# Patient Record
Sex: Female | Born: 1960
Health system: Southern US, Community
[De-identification: ages and names within clinical notes are randomized; demographics above are authoritative.]

## PROBLEM LIST (undated history)

## (undated) DIAGNOSIS — IMO0002 Reserved for concepts with insufficient information to code with codable children: Secondary | ICD-10-CM

## (undated) DIAGNOSIS — G40909 Epilepsy, unspecified, not intractable, without status epilepticus: Secondary | ICD-10-CM

## (undated) DIAGNOSIS — M329 Systemic lupus erythematosus, unspecified: Secondary | ICD-10-CM

## (undated) HISTORY — PX: OTHER SURGICAL HISTORY: SHX169

## (undated) HISTORY — PX: CHOLECYSTECTOMY: SHX55

## (undated) HISTORY — PX: FOOT SURGERY: SHX648

## (undated) HISTORY — PX: KNEE SURGERY: SHX244

## (undated) HISTORY — DX: Systemic lupus erythematosus, unspecified: M32.9

## (undated) HISTORY — DX: Reserved for concepts with insufficient information to code with codable children: IMO0002

---

## 2015-02-12 ENCOUNTER — Ambulatory Visit: Payer: Self-pay | Admitting: Physician Assistant

## 2015-10-17 ENCOUNTER — Emergency Department
Admission: EM | Admit: 2015-10-17 | Discharge: 2015-10-17 | Disposition: A | Discharge status: Home / Self Care | Payer: BLUE CROSS/BLUE SHIELD | Source: Home / Self Care | Attending: Family Medicine | Admitting: Family Medicine

## 2015-10-17 ENCOUNTER — Encounter: Payer: Self-pay | Admitting: Emergency Medicine

## 2015-10-17 DIAGNOSIS — J069 Acute upper respiratory infection, unspecified: Secondary | ICD-10-CM

## 2015-10-17 DIAGNOSIS — B9789 Other viral agents as the cause of diseases classified elsewhere: Principal | ICD-10-CM

## 2015-10-17 HISTORY — DX: Epilepsy, unspecified, not intractable, without status epilepticus: G40.909

## 2015-10-17 LAB — POCT RAPID STREP A (OFFICE): Rapid Strep A Screen: NEGATIVE

## 2015-10-17 MED ORDER — BENZONATATE 200 MG PO CAPS: 200.0000 mg | ORAL_CAPSULE | Freq: Every day | ORAL | Status: DC

## 2015-10-17 MED ORDER — PREDNISONE 20 MG PO TABS: 20.0000 mg | ORAL_TABLET | Freq: Two times a day (BID) | ORAL | Status: DC

## 2015-10-17 NOTE — ED Notes (Addendum)
Pt c/o sore throat and dry cough that started yesterday. No fever. Also noticed white spots on tonsils.

## 2015-10-17 NOTE — Discharge Instructions (Signed)
Take plain guaifenesin (1200mg extended release tabs such as Mucinex) twice daily, with plenty of water, for cough and congestion.  May add Pseudoephedrine (30mg, one or two every 4 to 6 hours) for sinus congestion.  Get adequate rest.   °May use Afrin nasal spray (or generic oxymetazoline) twice daily for about 5 days and then discontinue.  Also recommend using saline nasal spray several times daily and saline nasal irrigation (AYR is a common brand).  Use Flonase nasal spray each morning after using Afrin nasal spray and saline nasal irrigation. °Try warm salt water gargles for sore throat.  °Stop all antihistamines for now, and other non-prescription cough/cold preparations. °Follow-up with family doctor if not improving about10 days.  °

## 2015-10-17 NOTE — ED Provider Notes (Signed)
CSN: [id number]     Arrival date & time 10/17/15  1038 History   First MD Initiated Contact with Patient 10/17/15 1113     Chief Complaint  Patient presents with  . Sore Throat      HPI Comments: Patient complains of one day history of typical cold-like symptoms including mild sore throat, sinus congestion, headache, fatigue, and cough.  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  The history is provided by the patient.    Past Medical History  Diagnosis Date  . Epilepsy [facility])    Past Surgical History  Procedure Laterality Date  . Cholecystectomy    . Uterine ablation    . Knee surgery    . Foot surgery     Family History  Problem Relation Age of Onset  . Stroke Mother   . Hypertension Mother   . Hypertension Father   . Parkinson's disease Father    Social History  Substance Use Topics  . Smoking status: Never Smoker   . Smokeless tobacco: Never Used  . Alcohol Use: 1.8 oz/week    3 Cans of beer per week   OB History    No data available     Review of Systems + sore throat + cough + sneezing No pleuritic pain No wheezing + nasal congestion + post-nasal drainage No sinus pain/pressure No itchy/red eyes No earache No hemoptysis No SOB No fever/chills No nausea No vomiting No abdominal pain No diarrhea No urinary symptoms No skin rash + fatigue No myalgias + headache Used OTC meds without relief  Allergies  Azithromycin; Bactrim; Dilantin; Doxycycline; Penicillins; and Sulfa antibiotics  Home Medications   Prior to Admission medications   Medication Sig Start Date End Date Taking? Authorizing Provider  levETIRAcetam (KEPPRA) 250 MG tablet Take 250 mg by mouth 2 (two) times daily.   Yes Historical Provider, MD  benzonatate (TESSALON) 200 MG capsule Take 1 capsule (200 mg total) by mouth at bedtime. Take as needed for cough 10/17/15   Lattie Haw, MD  predniSONE (DELTASONE) 20 MG tablet Take 1 tablet (20  mg total) by mouth 2 (two) times daily. Take with food. 10/17/15   Lattie Haw, MD   Meds Ordered and Administered this Visit  Medications - No data to display  BP 135/85 mmHg  Pulse 74  Temp(Src) 97.4 F (36.3 C) (Oral)  Wt 162 lb (73.483 kg)  SpO2 98% No data found.   Physical Exam Nursing notes and Vital Signs reviewed. Appearance:  Patient appears stated age, and in no acute distress Eyes:  Pupils are equal, round, and reactive to light and accomodation.  Extraocular movement is intact.  Conjunctivae are not inflamed  Ears:  Canals normal.  Tympanic membranes normal.  Nose:  Congested turbinates.  No sinus tenderness.   Pharynx:  Uvula mildly swollen. Neck:  Supple.   Tender enlarged posterior nodes are palpated bilaterally  Lungs:  Clear to auscultation.  Breath sounds are equal.  Moving air well. Heart:  Regular rate and rhythm without murmurs, rubs, or gallops.  Abdomen:  Nontender without masses or hepatosplenomegaly.  Bowel sounds are present.  No CVA or flank tenderness.  Extremities:  No edema.  No calf tenderness Skin:  No rash present.   ED Course  Procedures  None    Labs Reviewed  POCT RAPID STREP A (OFFICE) negative    MDM   1. Viral URI with cough    There is  no evidence of bacterial infection today.  Treat symptomatically for now  Begin prednisone burst. Prescription written for Benzonatate (Tessalon) to take at bedtime for night-time cough.  Take plain guaifenesin (1200mg  extended release tabs such as Mucinex) twice daily, with plenty of water, for cough and congestion.  May add Pseudoephedrine (30mg , one or two every 4 to 6 hours) for sinus congestion.  Get adequate rest.   May use Afrin nasal spray (or generic oxymetazoline) twice daily for about 5 days and then discontinue.  Also recommend using saline nasal spray several times daily and saline nasal irrigation (AYR is a common brand).  Use Flonase nasal spray each morning after using Afrin nasal  spray and saline nasal irrigation. Try warm salt water gargles for sore throat.  Stop all antihistamines for now, and other non-prescription cough/cold preparations.  Follow-up with family doctor if not improving about10 days.     Lattie HawStephen A Beese, MD 10/22/15 [phone]

## 2015-11-19 LAB — LIPID PANEL
Cholesterol: 215 mg/dL — AB (ref 0–200)
HDL: 59 mg/dL (ref 35–70)
LDL Cholesterol: 123 mg/dL
Triglycerides: 166 mg/dL — AB (ref 40–160)

## 2015-11-19 LAB — CBC AND DIFFERENTIAL
HCT: 40 % (ref 36–46)
Hemoglobin: 13.7 g/dL (ref 12.0–16.0)
Platelets: 261 10*3/uL (ref 150–399)
WBC: 6.4 10^3/mL

## 2015-11-19 LAB — HEMOGLOBIN A1C: Hemoglobin A1C: 5.6

## 2015-11-19 LAB — HEPATIC FUNCTION PANEL
ALT: 35 U/L (ref 7–35)
AST: 19 U/L (ref 13–35)
Alkaline Phosphatase: 60 U/L (ref 25–125)
Bilirubin, Total: 0.5 mg/dL

## 2015-11-19 LAB — BASIC METABOLIC PANEL
BUN: 11 mg/dL (ref 4–21)
Creatinine: 0.8 mg/dL (ref <=1.1)
Glucose: 89 mg/dL
Potassium: 4.6 mmol/L (ref 3.4–5.3)
Sodium: 149 mmol/L — AB (ref 137–147)

## 2016-02-14 LAB — HM PAP SMEAR: HM Pap smear: NEGATIVE

## 2016-11-24 ENCOUNTER — Encounter: Payer: Self-pay | Admitting: Physician Assistant

## 2016-11-24 ENCOUNTER — Ambulatory Visit (INDEPENDENT_AMBULATORY_CARE_PROVIDER_SITE_OTHER): Payer: BLUE CROSS/BLUE SHIELD | Admitting: Physician Assistant

## 2016-11-24 VITALS — BP 121/78 | HR 78 | Ht 59.0 in | Wt 155.0 lb

## 2016-11-24 DIAGNOSIS — G40909 Epilepsy, unspecified, not intractable, without status epilepticus: Secondary | ICD-10-CM

## 2016-11-24 DIAGNOSIS — R5383 Other fatigue: Secondary | ICD-10-CM

## 2016-11-24 DIAGNOSIS — Z1231 Encounter for screening mammogram for malignant neoplasm of breast: Secondary | ICD-10-CM

## 2016-11-24 DIAGNOSIS — G4733 Obstructive sleep apnea (adult) (pediatric): Secondary | ICD-10-CM

## 2016-11-24 DIAGNOSIS — Z0000 Encounter for general adult medical examination without abnormal findings: Secondary | ICD-10-CM | POA: Diagnosis not present

## 2016-11-24 DIAGNOSIS — Z1211 Encounter for screening for malignant neoplasm of colon: Secondary | ICD-10-CM

## 2016-11-24 DIAGNOSIS — Z9989 Dependence on other enabling machines and devices: Secondary | ICD-10-CM

## 2016-11-24 LAB — CBC
HCT: 40.9 % (ref 35.0–45.0)
Hemoglobin: 13.8 g/dL (ref 11.7–15.5)
MCH: 29.8 pg (ref 27.0–33.0)
MCHC: 33.7 g/dL (ref 32.0–36.0)
MCV: 88.3 fL (ref 80.0–100.0)
MPV: 10 fL (ref 7.5–12.5)
Platelets: 224 10*3/uL (ref 140–400)
RBC: 4.63 MIL/uL (ref 3.80–5.10)
RDW: 13.3 % (ref 11.0–15.0)
WBC: 5.9 10*3/uL (ref 3.8–10.8)

## 2016-11-24 LAB — LIPID PANEL
Cholesterol: 214 mg/dL — ABNORMAL HIGH (ref <200)
HDL: 54 mg/dL (ref >50)
LDL Cholesterol: 130 mg/dL — ABNORMAL HIGH (ref <100)
Total CHOL/HDL Ratio: 4 Ratio (ref <5.0)
Triglycerides: 149 mg/dL (ref <150)
VLDL: 30 mg/dL (ref <30)

## 2016-11-24 LAB — COMPREHENSIVE METABOLIC PANEL
ALT: 20 U/L (ref 6–29)
AST: 14 U/L (ref 10–35)
Albumin: 4.5 g/dL (ref 3.6–5.1)
Alkaline Phosphatase: 54 U/L (ref 33–130)
BUN: 15 mg/dL (ref 7–25)
CO2: 27 mmol/L (ref 20–31)
Calcium: 10 mg/dL (ref 8.6–10.4)
Chloride: 102 mmol/L (ref 98–110)
Creat: 0.82 mg/dL (ref 0.50–1.05)
Glucose, Bld: 89 mg/dL (ref 65–99)
Potassium: 4.3 mmol/L (ref 3.5–5.3)
Sodium: 140 mmol/L (ref 135–146)
Total Bilirubin: 0.4 mg/dL (ref 0.2–1.2)
Total Protein: 6.7 g/dL (ref 6.1–8.1)

## 2016-11-24 LAB — FERRITIN: Ferritin: 96 ng/mL (ref 10–232)

## 2016-11-24 LAB — VITAMIN B12: Vitamin B-12: 1646 pg/mL — ABNORMAL HIGH (ref 200–1100)

## 2016-11-24 LAB — TSH: TSH: 0.93 mIU/L

## 2016-11-24 NOTE — Patient Instructions (Signed)
1200-1500 calories    Keeping You Healthy  Get These Tests  Blood Pressure- Have your blood pressure checked by your healthcare provider at least once a year.  Normal blood pressure is 120/80.  Weight- Have your body mass index (BMI) calculated to screen for obesity.  BMI is a measure of body fat based on height and weight.  You can calculate your own BMI at https://www.west-esparza.com/www.nhlbisupport.com/bmi/  Cholesterol- Have your cholesterol checked every year.  Diabetes- Have your blood sugar checked every year if you have high blood pressure, high cholesterol, a family history of diabetes or if you are overweight.  Pap Test - Have a pap test every 1 to 5 years if you have been sexually active.  If you are older than 65 and recent pap tests have been normal you may not need additional pap tests.  In addition, if you have had a hysterectomy  for benign disease additional pap tests are not necessary.  Mammogram-Yearly mammograms are essential for early detection of breast cancer  Screening for Colon Cancer- Colonoscopy starting at age 58. Screening may begin sooner depending on your family history and other health conditions.  Follow up colonoscopy as directed by your Gastroenterologist.  Screening for Osteoporosis- Screening begins at age 58 with bone density scanning, sooner if you are at higher risk for developing Osteoporosis.  Get these medicines  Calcium with Vitamin D- Your body requires 1200-1500 mg of Calcium a day and [phone] IU of Vitamin D a day.  You can only absorb 500 mg of Calcium at a time therefore Calcium must be taken in 2 or 3 separate doses throughout the day.  Hormones- Hormone therapy has been associated with increased risk for certain cancers and heart disease.  Talk to your healthcare provider about if you need relief from menopausal symptoms.  Aspirin- Ask your healthcare provider about taking Aspirin to prevent Heart Disease and Stroke.  Get these Immuniztions  Flu shot- Every  fall  Pneumonia shot- Once after the age of 58; if you are younger ask your healthcare provider if you need a pneumonia shot.  Tetanus- Every ten years.  Zostavax- Once after the age of 58 to prevent shingles.  Take these steps  Don't smoke- Your healthcare provider can help you quit. For tips on how to quit, ask your healthcare provider or go to www.smokefree.gov or call 1-800 QUIT-NOW.  Be physically active- Exercise 5 days a week for a minimum of 30 minutes.  If you are not already physically active, start slow and gradually work up to 30 minutes of moderate physical activity.  Try walking, dancing, bike riding, swimming, etc.  Eat a healthy diet- Eat a variety of healthy foods such as fruits, vegetables, whole grains, low fat milk, low fat cheeses, yogurt, lean meats, chicken, fish, eggs, dried beans, tofu, etc.  For more information go to www.thenutritionsource.org  Dental visit- Brush and floss teeth twice daily; visit your dentist twice a year.  Eye exam- Visit your Optometrist or Ophthalmologist yearly.  Drink alcohol in moderation- Limit alcohol intake to one drink or less a day.  Never drink and drive.  Depression- Your emotional health is as important as your physical health.  If you're feeling down or losing interest in things you normally enjoy, please talk to your healthcare provider.  Seat Belts- can save your life; always wear one  Smoke/Carbon Monoxide detectors- These detectors need to be installed on the appropriate level of your home.  Replace batteries at least once  a year.  Violence- If anyone is threatening or hurting you, please tell your healthcare provider.  Living Will/ Health care power of attorney- Discuss with your healthcare provider and family.

## 2016-11-24 NOTE — Progress Notes (Addendum)
Subjective:     Bonnie Allen is a 56 y.o. female and is here for a comprehensive physical exam. The patient reports she has complex partial seizures and takes Keppra and Onfi, which are managed by her neurologist.  She states her last seizure was 11/18/16, and that her neurologist has recently increased her dose of onfi.  Patient reports she would like to discuss her weight gain.  She states she has tried the "daniel" diet, and lost 20lb but the lack of protein lead to increased seizures.   She states she uses myfitness pal to track food and walks on the treadmill 3 times per week.  She states she is not interested in taking a weight loss medications, but she would like advice for weight loss.    Patient states she takes calicum and vitamin D 1200mg  daily, B12, as well as a multivitamin, and fish oil.    She states she had a dexa scan 1 year, which did not show osteopenia or osteoporosis.  She does state she needs a mammogram and a colonoscopy.  Patient declined getting a flu shot today.      Patient reports she would like routine labs and a TSH checked.  She states she has noticed increased fatigue even when she sleeps 10 hours per night.    Social History   Social History  . Marital status: Married    Spouse name: N/A  . Number of children: N/A  . Years of education: N/A   Occupational History  . Not on file.   Social History Main Topics  . Smoking status: Never Smoker  . Smokeless tobacco: Never Used  . Alcohol use 1.8 oz/week    3 Cans of beer per week  . Drug use: No  . Sexual activity: Yes   Other Topics Concern  . Not on file   Social History Narrative  . No narrative on file   Health Maintenance  Topic Date Due  . Hepatitis C Screening  [birth year]  . HIV Screening  05/13/1974  . TETANUS/TDAP  05/13/1978  . PAP SMEAR  05/13/1980  . MAMMOGRAM  05/13/2009  . COLONOSCOPY  05/13/2009  . INFLUENZA VACCINE  11/24/2017 (Originally 05/25/2016)    The following portions of  the patient's history were reviewed and updated as appropriate: allergies, current medications, past family history, past medical history, past social history, past surgical history and problem list.  Review of Systems A comprehensive review of systems was negative.   Objective:    General appearance: alert, cooperative and appears stated age Head: Normocephalic, without obvious abnormality, atraumatic Eyes: conjunctivae/corneas clear. PERRL, EOM's intact. Fundi benign. Ears: normal TM's and external ear canals both ears Nose: Nares normal. Septum midline. Mucosa normal. No drainage or sinus tenderness. Throat: lips, mucosa, and tongue normal; teeth and gums normal Neck: no adenopathy, no carotid bruit, no JVD, supple, symmetrical, trachea midline and thyroid not enlarged, symmetric, no tenderness/mass/nodules Back: symmetric, no curvature. ROM normal. No CVA tenderness. Lungs: clear to auscultation bilaterally Heart: regular rate and rhythm, S1, S2 normal, no murmur, click, rub or gallop Abdomen: soft, non-tender; bowel sounds normal; no masses,  no organomegaly Extremities: extremities normal, atraumatic, no cyanosis or edema Pulses: 2+ and symmetric Skin: Skin color, texture, turgor normal. No rashes or lesions    Assessment:    Healthy female exam.      Plan:  Marland Kitchen.Marland Kitchen.Bonnie Allen was seen today for establish care.  Diagnoses and all orders for this visit:  Seizure disorder (HCC)  OSA on CPAP  Routine physical examination -     MM DIGITAL SCREENING BILATERAL -     Ambulatory referral to Gastroenterology -     CBC -     Comprehensive metabolic panel -     C-reactive protein -     Ferritin -     Sedimentation rate -     TSH -     Vitamin B12 -     Vit D  25 hydroxy (rtn osteoporosis monitoring) -     Lipid panel  Visit for screening mammogram -     MM DIGITAL SCREENING BILATERAL  Colon cancer screening -     Ambulatory referral to Gastroenterology  No energy -     CBC -      Comprehensive metabolic panel -     C-reactive protein -     Ferritin -     Sedimentation rate -     TSH -     Vitamin B12 -     Vit D  25 hydroxy (rtn osteoporosis monitoring) -     Lipid panel       See After Visit Summary for Counseling Recommendations    Patient was encouraged to start taking a daily aspirin 81mg  for cardiovascular health.   Encouraged patient to continue to walk on the treadmill.  Recommended at least 150 minutes of exercise weekly.  Instructed patient to continue to track her calories and diet with myfitnesspal and try to keep calories 1200-1500 calories per day and eat smaller meals more frequently.      Patient will continue to follow-up with her neurologist for the management of her epilepsy.    Routine labs were ordered to screen for diabetes, check renal and liver function, and lipids.  A TSH, CBC, ferritin, sed rate, CRP, B12 and vitamin D level will be checked due to patient's increased fatigue.     Orders were placed for a screening colonoscopy and mammogram.    Patient can follow-up as needed.

## 2016-11-25 LAB — SEDIMENTATION RATE: Sed Rate: 1 mm/hr (ref 0–30)

## 2016-11-25 LAB — VITAMIN D 25 HYDROXY (VIT D DEFICIENCY, FRACTURES): Vit D, 25-Hydroxy: 65 ng/mL (ref 30–100)

## 2016-11-25 LAB — C-REACTIVE PROTEIN: CRP: 5.8 mg/L (ref <8.0)

## 2016-11-26 ENCOUNTER — Encounter: Payer: Self-pay | Admitting: Physician Assistant

## 2016-11-26 DIAGNOSIS — E785 Hyperlipidemia, unspecified: Secondary | ICD-10-CM | POA: Insufficient documentation

## 2016-11-27 ENCOUNTER — Encounter: Payer: Self-pay | Admitting: Physician Assistant

## 2016-12-10 ENCOUNTER — Ambulatory Visit (INDEPENDENT_AMBULATORY_CARE_PROVIDER_SITE_OTHER): Payer: BLUE CROSS/BLUE SHIELD

## 2016-12-10 DIAGNOSIS — Z1231 Encounter for screening mammogram for malignant neoplasm of breast: Secondary | ICD-10-CM | POA: Diagnosis not present

## 2016-12-10 NOTE — Progress Notes (Signed)
Call pt: normal mammogram. Follow up in 1 year.

## 2017-01-03 ENCOUNTER — Encounter: Payer: Self-pay | Admitting: Physician Assistant

## 2017-01-31 ENCOUNTER — Encounter: Payer: Self-pay | Admitting: Emergency Medicine

## 2017-01-31 ENCOUNTER — Emergency Department
Admission: EM | Admit: 2017-01-31 | Discharge: 2017-01-31 | Disposition: A | Discharge status: Home / Self Care | Payer: BLUE CROSS/BLUE SHIELD | Source: Home / Self Care | Attending: Family Medicine | Admitting: Family Medicine

## 2017-01-31 DIAGNOSIS — J040 Acute laryngitis: Secondary | ICD-10-CM | POA: Diagnosis not present

## 2017-01-31 LAB — POCT RAPID STREP A (OFFICE): Rapid Strep A Screen: NEGATIVE

## 2017-01-31 MED ORDER — PREDNISONE 20 MG PO TABS: ORAL_TABLET | ORAL | 0 refills | Status: DC

## 2017-01-31 MED ORDER — FLUTICASONE PROPIONATE 50 MCG/ACT NA SUSP: 2.0000 | Freq: Every day | NASAL | 2 refills | Status: DC

## 2017-01-31 NOTE — ED Triage Notes (Signed)
Pt c/o laryngitis and nasal congestion x2 days. States it is getting worse. She is using OTC meds.

## 2017-01-31 NOTE — ED Provider Notes (Signed)
CSN: [id number]     Arrival date & time 01/31/17  1727 History   First MD Initiated Contact with Patient 01/31/17 1744     Chief Complaint  Patient presents with  . Laryngitis   (Consider location/radiation/quality/duration/timing/severity/associated sxs/prior Treatment) HPI Bonnie Allen is a 56 y.o. female presenting to UC with c/o gradually worsening hoarse voice and nasal congestion for 2-3 days.  Symptoms initially started with a sore throat but the throat pain has resolved. She has felt some swelling of her lymph nodes, worse on Left side of neck and mild Left ear soreness. Denies fever, chills, n/v/d. No known sick contacts. She has tried OTC mucinex, warm tea with honey and saltwater gargles w/o relief.    Past Medical History:  Diagnosis Date  . Epilepsy [facility])    Past Surgical History:  Procedure Laterality Date  . CESAREAN SECTION    . CHOLECYSTECTOMY    . FOOT SURGERY    . KNEE SURGERY    . uterine ablation     Family History  Problem Relation Age of Onset  . Stroke Mother   . Hypertension Mother   . Hypertension Father   . Parkinson's disease Father   . Hyperlipidemia Father   . Drug abuse Paternal Uncle   . Cancer Paternal Grandmother     lung  . Stroke Paternal Grandmother   . Cancer Paternal Grandfather     lung   Social History  Substance Use Topics  . Smoking status: Never Smoker  . Smokeless tobacco: Never Used  . Alcohol use 1.8 oz/week    3 Cans of beer per week   OB History    No data available     Review of Systems  Constitutional: Negative for chills and fever.  HENT: Positive for congestion, rhinorrhea, sore throat and voice change. Negative for ear pain and trouble swallowing.   Respiratory: Negative for cough and shortness of breath.   Cardiovascular: Negative for chest pain and palpitations.  Gastrointestinal: Negative for abdominal pain, diarrhea, nausea and vomiting.  Musculoskeletal: Negative for arthralgias, back pain and myalgias.   Skin: Negative for rash.    Allergies  Azithromycin; Bactrim [sulfamethoxazole-trimethoprim]; Dilantin [phenytoin sodium extended]; Doxycycline; Penicillins; and Sulfa antibiotics  Home Medications   Prior to Admission medications   Medication Sig Start Date End Date Taking? Authorizing Provider  Calcium Citrate-Vitamin D (CALCIUM + D PO) Take by mouth.    Historical Provider, MD  CloBAZam (ONFI PO) Take 50 mg by mouth daily.    Historical Provider, MD  Cyanocobalamin (B-12 PO) Take by mouth.    Historical Provider, MD  fluticasone (FLONASE) 50 MCG/ACT nasal spray Place 2 sprays into both nostrils daily. 01/31/17   Junius Finner, PA-C  LevETIRAcetam (KEPPRA PO) Take 2,250 mg by mouth at bedtime.    Historical Provider, MD  Multiple Vitamin (MULTIVITAMIN) tablet Take 1 tablet by mouth daily.    Historical Provider, MD  Omega-3 Fatty Acids (FISH OIL PO) Take by mouth.    Historical Provider, MD  predniSONE (DELTASONE) 20 MG tablet 3 tabs po day one, then 2 po daily x 4 days 01/31/17   Junius Finner, PA-C   Meds Ordered and Administered this Visit  Medications - No data to display  BP 125/73 (BP Location: Right Arm)   Pulse 76   Temp 98 F (36.7 C) (Oral)   Wt 155 lb (70.3 kg)   SpO2 96%   BMI 31.31 kg/m  No data found.   Physical Exam  Constitutional: She is oriented to person, place, and time. She appears well-developed and well-nourished. No distress.  HENT:  Head: Normocephalic and atraumatic.  Right Ear: Tympanic membrane normal.  Left Ear: Tympanic membrane normal.  Nose: Nose normal.  Mouth/Throat: Uvula is midline and mucous membranes are normal. Oropharyngeal exudate and posterior oropharyngeal erythema present. No posterior oropharyngeal edema.  Eyes: EOM are normal.  Neck: Normal range of motion. Neck supple.  Hoarse voice, no stridor.   Cardiovascular: Normal rate and regular rhythm.   Pulmonary/Chest: Effort normal and breath sounds normal. No stridor. No  respiratory distress. She has no wheezes. She has no rales.  Musculoskeletal: Normal range of motion.  Lymphadenopathy:    She has cervical adenopathy.  Neurological: She is alert and oriented to person, place, and time.  Skin: Skin is warm and dry. She is not diaphoretic.  Psychiatric: She has a normal mood and affect. Her behavior is normal.  Nursing note and vitals reviewed.   Urgent Care Course     Procedures (including critical care time)  Labs Review Labs Reviewed  POCT RAPID STREP A (OFFICE)    Imaging Review No results found.    MDM   1. Laryngitis    Rapid strep- negative  Symptoms likely viral Encouraged to keep trying symptomatic treatment. Rx: Prednisone and Flonase  f/u with PCP toward end of week if not improving.     Junius Finner, PA-C 01/31/17 1825

## 2017-02-03 ENCOUNTER — Ambulatory Visit (INDEPENDENT_AMBULATORY_CARE_PROVIDER_SITE_OTHER): Payer: BLUE CROSS/BLUE SHIELD | Admitting: Physician Assistant

## 2017-02-04 ENCOUNTER — Ambulatory Visit: Payer: BLUE CROSS/BLUE SHIELD | Admitting: Family Medicine

## 2017-02-04 ENCOUNTER — Ambulatory Visit (INDEPENDENT_AMBULATORY_CARE_PROVIDER_SITE_OTHER): Payer: BLUE CROSS/BLUE SHIELD | Admitting: Family Medicine

## 2017-02-04 ENCOUNTER — Encounter: Payer: Self-pay | Admitting: Family Medicine

## 2017-02-04 VITALS — BP 123/75 | HR 76 | Ht 59.0 in | Wt 156.0 lb

## 2017-02-04 DIAGNOSIS — B9789 Other viral agents as the cause of diseases classified elsewhere: Secondary | ICD-10-CM

## 2017-02-04 DIAGNOSIS — J069 Acute upper respiratory infection, unspecified: Secondary | ICD-10-CM | POA: Diagnosis not present

## 2017-02-04 NOTE — Progress Notes (Signed)
   Subjective:    Patient ID: Bonnie Allen, female    DOB: 1960-11-09, 56 y.o.   MRN: [id number]  HPI 56 year old female with 8 days of nasal congestion and cough with productive sputum. She was actually seen in urgent care on April night, approximately 4 days ago and diagnosed with laryngitis. She had a negative strep test at that time. She was given prednisone and Flonase. She is here today because she is not feeling any better. Waking up with eyes crusting in the AM.  She has not had any facial pain or headache with it. She still feels congested. She does feel like she's getting a lot of postnasal drip. She feels like her cough is maybe a little bit better today. No shortness of breath or wheezing.   Review of Systems     Objective:   Physical Exam  Constitutional: She is oriented to person, place, and time. She appears well-developed and well-nourished.  HENT:  Head: Normocephalic and atraumatic.  Right Ear: External ear normal.  Left Ear: External ear normal.  Nose: Nose normal.  Mouth/Throat: Oropharynx is clear and moist.  TMs and canals are clear.   Eyes: Conjunctivae and EOM are normal. Pupils are equal, round, and reactive to light.  Neck: Neck supple. No thyromegaly present.  Cardiovascular: Normal rate, regular rhythm and normal heart sounds.   Pulmonary/Chest: Effort normal and breath sounds normal. She has no wheezes.  Lymphadenopathy:    She has no cervical adenopathy.  Neurological: She is alert and oriented to person, place, and time.  Skin: Skin is warm and dry.  Psychiatric: She has a normal mood and affect.          Assessment & Plan:  Upper respiratory infection-recommend warm compresses for her eyes. Continue over-the-counter medication as needed. Encouraged her to rest and hydrate over the weekend. If she is not feeling better by Monday then give [place] a call back and we can consider treatment with either a cephalosporin or possibly a fluoroquinolone since she  has multiple drug intolerances. I do feel like she is getting better.

## 2017-02-09 ENCOUNTER — Telehealth: Payer: Self-pay | Admitting: *Deleted

## 2017-02-09 MED ORDER — CEFDINIR 300 MG PO CAPS: 300.0000 mg | ORAL_CAPSULE | Freq: Two times a day (BID) | ORAL | 0 refills | Status: DC

## 2017-02-09 NOTE — Telephone Encounter (Signed)
Pt called and stated that she was seen on Friday for URI and was advised that if she wasn't feeling any better to call and she would be given an ABX. Pt states that she is not feeling any better and would like this to be sent to her pharmacy.  As per the assessment and plan:  Consider treatment with either a cephalosporin or possibly a fluoroquinolone since she has multiple drug intolerances.  Will fwd to Dr. Linford Arnold for f/u.Loralee Pacas Shiner

## 2017-02-09 NOTE — Telephone Encounter (Signed)
Rx sent for omnicef.  Call if not better in 5-7 days.

## 2017-02-10 ENCOUNTER — Ambulatory Visit (INDEPENDENT_AMBULATORY_CARE_PROVIDER_SITE_OTHER): Payer: BLUE CROSS/BLUE SHIELD | Admitting: Physician Assistant

## 2017-02-10 ENCOUNTER — Encounter: Payer: Self-pay | Admitting: Family Medicine

## 2017-02-10 ENCOUNTER — Encounter: Payer: Self-pay | Admitting: Physician Assistant

## 2017-02-10 VITALS — BP 129/76 | HR 73 | Temp 97.9°F | Ht 59.0 in | Wt 157.0 lb

## 2017-02-10 DIAGNOSIS — L304 Erythema intertrigo: Secondary | ICD-10-CM

## 2017-02-10 DIAGNOSIS — B029 Zoster without complications: Secondary | ICD-10-CM

## 2017-02-10 MED ORDER — NYSTATIN 100000 UNIT/GM EX CREA: 1.0000 "application " | TOPICAL_CREAM | Freq: Two times a day (BID) | CUTANEOUS | 0 refills | Status: DC

## 2017-02-10 MED ORDER — VALACYCLOVIR HCL 1 G PO TABS: 1000.0000 mg | ORAL_TABLET | Freq: Three times a day (TID) | ORAL | 0 refills | Status: DC

## 2017-02-10 NOTE — Telephone Encounter (Signed)
Pt advised, she does have an appt with a Provider today. She will keep that appt.

## 2017-02-10 NOTE — Patient Instructions (Signed)
Shingles Shingles is an infection that causes a painful skin rash and fluid-filled blisters. Shingles is caused by the same virus that causes chickenpox. Shingles only develops in people who:  Have had chickenpox.  Have gotten the chickenpox vaccine. (This is rare.) The first symptoms of shingles may be itching, tingling, or pain in an area on your skin. A rash will follow in a few days or weeks. The rash is usually on one side of the body in a bandlike or beltlike pattern. Over time, the rash turns into fluid-filled blisters that break open, scab over, and dry up. Medicines may:  Help you manage pain.  Help you recover more quickly.  Help to prevent long-term problems. Follow these instructions at home: Medicines  Take medicines only as told by your doctor.  Apply an anti-itch or numbing cream to the affected area as told by your doctor. Blister and Rash Care  Take a cool bath or put cool compresses on the area of the rash or blisters as told by your doctor. This may help with pain and itching.  Keep your rash covered with a loose bandage (dressing). Wear loose-fitting clothing.  Keep your rash and blisters clean with mild soap and cool water or as told by your doctor.  Check your rash every day for signs of infection. These include redness, swelling, and pain that lasts or gets worse.  Do not pick your blisters.  Do not scratch your rash. General instructions  Rest as told by your doctor.  Keep all follow-up visits as told by your doctor. This is important.  Until your blisters scab over, your infection can cause chickenpox in people who have never had it or been vaccinated against it. To prevent this from happening, avoid touching other people or being around other people, especially:  Babies.  Pregnant women.  Children who have eczema.  Elderly people who have transplants.  People who have chronic illnesses, such as leukemia or AIDS. Contact a doctor if:  Your  pain does not get better with medicine.  Your pain does not get better after the rash heals.  Your rash looks infected. Signs of infection include:  Redness.  Swelling.  Pain that lasts or gets worse. Get help right away if:  The rash is on your face or nose.  You have pain in your face, pain around your eye area, or loss of feeling on one side of your face.  You have ear pain or you have ringing in your ear.  You have loss of taste.  Your condition gets worse. This information is not intended to replace advice given to you by your health care provider. Make sure you discuss any questions you have with your health care provider. Document Released: 03/29/2008 Document Revised: 06/06/2016 Document Reviewed: 07/23/2014 Elsevier Interactive Patient Education  2017 Elsevier Inc.  

## 2017-02-10 NOTE — Progress Notes (Signed)
   Subjective:    Patient ID: Bonnie Allen, female    DOB: 06/16/1961, 56 y.o.   MRN: [id number]  HPI  Pt is a 56 yo female who presents to the clinic with painful burning rash of left chest wall breast and under breast. Rash has been present for 2 days. She has used neosporin without any benefit. No itching. Areas of rash seems to be getting a little bigger. She does have vesicles present and some have broken open and oozing. She had normal mammogram in feb of this year. Pt has not had shingles vaccine. Pt has recently been fighting and URI and very stressed taking care of her parents.    Review of Systems See HPI.     Objective:   Physical Exam  Constitutional: She appears well-developed and well-nourished.  HENT:  Head: Normocephalic and atraumatic.  Cardiovascular: Normal rate, regular rhythm and normal heart sounds.   Pulmonary/Chest: Effort normal and breath sounds normal.    Psychiatric: She has a normal mood and affect. Her behavior is normal.          Assessment & Plan:  Marland KitchenMarland KitchenRebbie was seen today for rash and cough.  Diagnoses and all orders for this visit:  Herpes zoster without complication -     valACYclovir (VALTREX) 1000 MG tablet; Take 1 tablet (1,000 mg total) by mouth 3 (three) times daily. For 7 day -     Viral culture  Intertrigo -     nystatin cream (MYCOSTATIN); Apply 1 application topically 2 (two) times daily. As needed under breast.   I suspect two things going on here. I feel like she has shingles with some intertrigo. Valtrex started. Discussed to not be around young children, sick older people or pregnant women.  Discussed cool compresses and OTC tylenol and NSAIDs for pain.  Nystatin given to use under left breast.  Culture sent for confirmation.

## 2017-02-12 ENCOUNTER — Encounter: Payer: Self-pay | Admitting: Physician Assistant

## 2017-02-12 DIAGNOSIS — B029 Zoster without complications: Secondary | ICD-10-CM | POA: Insufficient documentation

## 2017-02-12 DIAGNOSIS — L304 Erythema intertrigo: Secondary | ICD-10-CM | POA: Insufficient documentation

## 2017-02-15 ENCOUNTER — Emergency Department (INDEPENDENT_AMBULATORY_CARE_PROVIDER_SITE_OTHER): Payer: BLUE CROSS/BLUE SHIELD

## 2017-02-15 ENCOUNTER — Emergency Department
Admission: EM | Admit: 2017-02-15 | Discharge: 2017-02-15 | Disposition: A | Discharge status: Home / Self Care | Payer: BLUE CROSS/BLUE SHIELD | Source: Home / Self Care | Attending: Family Medicine | Admitting: Family Medicine

## 2017-02-15 ENCOUNTER — Encounter: Payer: Self-pay | Admitting: *Deleted

## 2017-02-15 DIAGNOSIS — J323 Chronic sphenoidal sinusitis: Secondary | ICD-10-CM

## 2017-02-15 DIAGNOSIS — J0130 Acute sphenoidal sinusitis, unspecified: Secondary | ICD-10-CM

## 2017-02-15 DIAGNOSIS — R519 Headache, unspecified: Secondary | ICD-10-CM

## 2017-02-15 DIAGNOSIS — R51 Headache: Secondary | ICD-10-CM | POA: Diagnosis not present

## 2017-02-15 DIAGNOSIS — G43909 Migraine, unspecified, not intractable, without status migrainosus: Secondary | ICD-10-CM

## 2017-02-15 DIAGNOSIS — G319 Degenerative disease of nervous system, unspecified: Secondary | ICD-10-CM

## 2017-02-15 LAB — REFLEX ADENOVIRUS CULTURE

## 2017-02-15 LAB — RFX HSV/VARICELLA ZOSTER RAPID CULT

## 2017-02-15 MED ORDER — DEXAMETHASONE SODIUM PHOSPHATE 10 MG/ML IJ SOLN
10.0000 mg | Freq: Once | INTRAMUSCULAR | Status: AC
  Administered 2017-02-15: 10 mg via INTRAMUSCULAR

## 2017-02-15 MED ORDER — ONDANSETRON 4 MG PO TBDP
4.0000 mg | ORAL_TABLET | Freq: Once | ORAL | Status: AC
  Administered 2017-02-15: 4 mg via ORAL

## 2017-02-15 MED ORDER — KETOROLAC TROMETHAMINE 60 MG/2ML IJ SOLN
60.0000 mg | Freq: Once | INTRAMUSCULAR | Status: AC
  Administered 2017-02-15: 60 mg via INTRAMUSCULAR

## 2017-02-15 NOTE — Discharge Instructions (Signed)
[employer]. Take plain guaifenesin (  extended release tabs such as Mucinex) twice daily, with plenty of water, for cough and congestion.  May add Pseudoephedrine ( , one or two every 4 to 6 hours) for sinus congestion.  Get adequate rest.   May use Afrin nasal spray (or generic oxymetazoline) each morning for about 5 days and then discontinue.  Also recommend using saline nasal spray several times daily and saline nasal irrigation (AYR is a common brand).  Use Flonase nasal spray each morning after using Afrin nasal spray and saline nasal irrigation. Try warm salt water gargles for sore throat.  Stop all antihistamines for now, and other non-prescription cough/cold preparations. May take Delsym Cough Suppressant at bedtime for nighttime cough.

## 2017-02-15 NOTE — ED Provider Notes (Signed)
[facility]    CSN: [id number] Arrival date & time: 02/15/17  1458     History   Chief Complaint Chief Complaint  Patient presents with  . Seizures  . Headache    HPI Bonnie Allen is a 56 y.o. female.   Patient developed a URI about two weeks ago and was subsequently treated with [facility], which she has not quite finished.  She has a history of seizure disorder, typically having about two seizures per week.  She also has a history of occasional migraine headache, usually on the right side.  At 2pm today she developed a typical seizure that lasted about 20 to 30 seconds.  Afterwards she developed a severed frontal and general headache, not typical for her usual migraines, that has persisted.  She notes that she never gets postictal headaches.  She denies other neurologic symptoms.  No recent fevers, chills, and sweats. She admits that she has been under increased stress recently.   The history is provided by the patient and the spouse.  Seizures  Headache  Associated symptoms: congestion, fatigue, nausea, seizures and sinus pressure   Associated symptoms: no ear pain, no fever, no numbness, no sore throat, no vomiting and no weakness     Past Medical History:  Diagnosis Date  . Epilepsy [facility])     Patient Active Problem List   Diagnosis Date Noted  . Intertrigo 02/12/2017  . Herpes zoster without complication 02/12/2017  . Hyperlipidemia LDL goal <130 11/26/2016  . OSA on CPAP 11/24/2016  . Seizure disorder (HCC) 11/24/2016    Past Surgical History:  Procedure Laterality Date  . CESAREAN SECTION    . CHOLECYSTECTOMY    . FOOT SURGERY    . KNEE SURGERY    . uterine ablation      OB History    No data available       Home Medications    Prior to Admission medications   Medication Sig Start Date End Date Taking? Authorizing Provider  Calcium Citrate-Vitamin D (CALCIUM + D PO) Take by mouth.    Historical Provider, MD  cefdinir (OMNICEF) 300 MG  capsule Take 1 capsule (300 mg total) by mouth 2 (two) times daily. 02/09/17   Agapito Games, MD  CloBAZam (ONFI PO) Take 30 mg by mouth 2 (two) times daily.     Historical Provider, MD  Cyanocobalamin (B-12 PO) Take by mouth.    Historical Provider, MD  escitalopram (LEXAPRO) 10 MG tablet Take 10 mg by mouth. 01/26/17 04/26/17  Historical Provider, MD  fluticasone (FLONASE) 50 MCG/ACT nasal spray Place 2 sprays into both nostrils daily. 01/31/17   Junius Finner, PA-C  LevETIRAcetam (KEPPRA PO) Take 2,250 mg by mouth at bedtime.    Historical Provider, MD  Multiple Vitamin (MULTIVITAMIN) tablet Take 1 tablet by mouth daily.    Historical Provider, MD  nystatin cream (MYCOSTATIN) Apply 1 application topically 2 (two) times daily. As needed under breast. 02/10/17   Jomarie Longs, PA-C  Omega-3 Fatty Acids (FISH OIL PO) Take by mouth.    Historical Provider, MD  valACYclovir (VALTREX) 1000 MG tablet Take 1 tablet (1,000 mg total) by mouth 3 (three) times daily. For 7 day 02/10/17   Jomarie Longs, PA-C    Family History Family History  Problem Relation Age of Onset  . Stroke Mother   . Hypertension Mother   . Hypertension Father   . Parkinson's disease Father   . Hyperlipidemia Father   . Drug  abuse Paternal Uncle   . Cancer Paternal Grandmother     lung  . Stroke Paternal Grandmother   . Cancer Paternal Grandfather     lung    Social History Social History  Substance Use Topics  . Smoking status: Never Smoker  . Smokeless tobacco: Never Used  . Alcohol use 1.8 oz/week    3 Cans of beer per week     Allergies   Azithromycin; Bactrim [sulfamethoxazole-trimethoprim]; Dilantin [phenytoin sodium extended]; Doxycycline; Penicillins; and Sulfa antibiotics   Review of Systems Review of Systems  Constitutional: Positive for activity change, appetite change and fatigue. Negative for chills, diaphoresis and fever.  HENT: Positive for congestion and sinus pressure. Negative for ear  pain, facial swelling, sinus pain, sore throat, trouble swallowing and voice change.   Eyes: Negative.   Respiratory: Negative.   Cardiovascular: Negative.   Gastrointestinal: Positive for nausea. Negative for vomiting.  Musculoskeletal: Negative.   Neurological: Positive for seizures and headaches. Negative for syncope, facial asymmetry, speech difficulty, weakness, light-headedness and numbness.     Physical Exam Triage Vital Signs ED Triage Vitals  Enc Vitals Group     BP 02/15/17 1514 140/86     Pulse Rate 02/15/17 1514 69     Resp 02/15/17 1514 18     Temp 02/15/17 1514 97.5 F (36.4 C)     Temp Source 02/15/17 1514 Oral     SpO2 02/15/17 1514 97 %     Weight --      Height --      Head Circumference --      Peak Flow --      Pain Score 02/15/17 1516 10     Pain Loc --      Pain Edu? --      Excl. in GC? --    No data found.   Updated Vital Signs BP 140/86 (BP Location: Left Arm)   Pulse 69   Temp 97.5 F (36.4 C) (Oral)   Resp 18   SpO2 97%   Visual Acuity Right Eye Distance:   Left Eye Distance:   Bilateral Distance:    Right Eye Near:   Left Eye Near:    Bilateral Near:     Physical Exam Nursing notes and Vital Signs reviewed. Appearance:  Patient appears stated age, and in no acute distress.  However, she appears uncomfortable sitting on exam table in darkened room. Eyes:  Pupils are equal, round, and reactive to light and accomodation.  Extraocular movement is intact. Fundi benign.  Conjunctivae are not inflamed.  Mild photophobia present.  Ears:  Canals normal.  Tympanic membranes normal.  Nose:  Congested turbinates.  No sinus tenderness.    Mouth/Pharynx:  Normal Neck:  Supple.  Tender enlarged posterior/lateral nodes are palpated bilaterally  Lungs:  Clear to auscultation.  Breath sounds are equal.  Moving air well. Heart:  Regular rate and rhythm without murmurs, rubs, or gallops.  Abdomen:  Nontender without masses or hepatosplenomegaly.   Bowel sounds are present.  No CVA or flank tenderness.  Extremities:  No edema.  Skin:  No rash present.  Neurologic:  Cranial nerves 2 through 12 are normal.  Patellar, achilles, and elbow reflexes are normal.  Cerebellar function is intact (finger-to-nose and rapid alternating hand movement).  Gait and station are normal.  Grip strength symmetric bilaterally.  UC Treatments / Results  Labs (all labs ordered are listed, but only abnormal results are displayed) Labs Reviewed - No data to display  EKG  EKG Interpretation None       Radiology Ct Head Wo Contrast  Result Date: 02/15/2017 CLINICAL DATA:  Seizure today. Severe headache and immediately after the seizure. The patient does not usually have headaches following seizures. History of migraine headaches. EXAM: CT HEAD WITHOUT CONTRAST TECHNIQUE: Contiguous axial images were obtained from the base of the skull through the vertex without intravenous contrast. COMPARISON:  None. FINDINGS: Brain: Mildly diffusely enlarged ventricles and subarachnoid spaces. Minimal patchy white matter low density in both cerebral hemispheres. No intracranial hemorrhage, mass lesion or CT evidence of acute infarction. Vascular: No hyperdense vessel or unexpected calcification. Skull: Normal. Negative for fracture or focal lesion. Sinuses/Orbits: Sphenoid sinus mucosal thickening and possible fluid. The included portions of the orbits are unremarkable. Other: None. IMPRESSION: 1. No acute intracranial abnormality. 2. Sphenoid sinusitis with a possible acute component. 3. Mild diffuse cerebral and cerebellar atrophy. 4. Minimal chronic small vessel white matter ischemic changes in both cerebral hemispheres, compatible with the history of migraine headaches. Electronically Signed   By: Beckie Salts M.D.   On: 02/15/2017 17:04    Procedures Procedures (including critical care time)  Medications Ordered in UC Medications  ketorolac (TORADOL) injection 60 mg (60  mg Intramuscular Given 02/15/17 1559)  dexamethasone (DECADRON) injection 10 mg (10 mg Intramuscular Given 02/15/17 1600)  ondansetron (ZOFRAN-ODT) disintegrating tablet 4 mg (4 mg Oral Given 02/15/17 1555)     Initial Impression / Assessment and Plan / UC Course  I have reviewed the triage vital signs and the nursing notes.  Pertinent labs & imaging results that were available during my care of the patient were reviewed by me and considered in my medical decision making (see chart for details).    Administered Toradol  IM  Administered Decadron  IM.  Administered Zofran ODT  PO  Suspect atypical migraine headache initiated by combination of viral URI, situational stress, sphenoid sinusitis, and recurrent seizure.  At time of discharge patient notes almost complete resolution of her headache. [employer]. Take plain guaifenesin (  extended release tabs such as Mucinex) twice daily, with plenty of water, for cough and congestion.  May add Pseudoephedrine ( , one or two every 4 to 6 hours) for sinus congestion.  Get adequate rest.   May use Afrin nasal spray (or generic oxymetazoline) each morning for about 5 days and then discontinue.  Also recommend using saline nasal spray several times daily and saline nasal irrigation (AYR is a common brand).  Use Flonase nasal spray each morning after using Afrin nasal spray and saline nasal irrigation. Try warm salt water gargles for sore throat.  Stop all antihistamines for now, and other non-prescription cough/cold preparations. May take Delsym Cough Suppressant at bedtime for nighttime cough.  Followup with Family Doctor if not improved in about 6 days.    Final Clinical Impressions(s) / UC Diagnoses   Final diagnoses:  Severe frontal headaches  Acute sphenoidal sinusitis, recurrence not specified    New Prescriptions New Prescriptions   No medications on file     Lattie Haw, MD 02/26/17 1000

## 2017-02-15 NOTE — ED Triage Notes (Addendum)
Patient and husband report partial seizure about 45 minutes ago lasting 30 seconds. Immediately after the seizure she developed a HA 10/10. She typically does not have headaches post-ictal. She has a strong h/o partial seizures about 1-2 a week. She is taking her antiseizure medications as prescribed. Currently on Omnicef and valtrex

## 2017-02-15 NOTE — Progress Notes (Signed)
Call pt: culture is positive for herpes zoster. You were treated appropriately. It can take time for pain/burning and rash to resolve. How are you doing?

## 2017-02-16 ENCOUNTER — Telehealth: Payer: Self-pay | Admitting: *Deleted

## 2017-02-16 NOTE — Telephone Encounter (Signed)
Spoke to pt she reports that she is feeling much better today. She spoke to her neurologist this morning and he increased her Keppra. She will call back if she has any questions or concerns.

## 2017-02-18 LAB — CYTOMEGALOVIRUS CULTURE

## 2017-02-18 LAB — VIRAL CULTURE VIRC

## 2017-02-21 ENCOUNTER — Ambulatory Visit (INDEPENDENT_AMBULATORY_CARE_PROVIDER_SITE_OTHER): Payer: BLUE CROSS/BLUE SHIELD | Admitting: Physician Assistant

## 2017-02-21 VITALS — BP 135/88 | HR 83 | Wt 155.0 lb

## 2017-02-21 DIAGNOSIS — R748 Abnormal levels of other serum enzymes: Secondary | ICD-10-CM

## 2017-02-21 DIAGNOSIS — J0130 Acute sphenoidal sinusitis, unspecified: Secondary | ICD-10-CM

## 2017-02-21 DIAGNOSIS — G43009 Migraine without aura, not intractable, without status migrainosus: Secondary | ICD-10-CM | POA: Insufficient documentation

## 2017-02-21 DIAGNOSIS — K219 Gastro-esophageal reflux disease without esophagitis: Secondary | ICD-10-CM | POA: Insufficient documentation

## 2017-02-21 DIAGNOSIS — Z881 Allergy status to other antibiotic agents status: Secondary | ICD-10-CM

## 2017-02-21 DIAGNOSIS — M8580 Other specified disorders of bone density and structure, unspecified site: Secondary | ICD-10-CM | POA: Insufficient documentation

## 2017-02-21 MED ORDER — LEVOFLOXACIN 500 MG PO TABS: 500.0000 mg | ORAL_TABLET | Freq: Every day | ORAL | 0 refills | Status: DC

## 2017-02-21 MED ORDER — PROMETHAZINE HCL 12.5 MG PO TABS: 12.5000 mg | ORAL_TABLET | Freq: Four times a day (QID) | ORAL | 0 refills | Status: DC | PRN

## 2017-02-21 MED ORDER — KETOROLAC TROMETHAMINE 30 MG/ML IJ SOLN
30.0000 mg | Freq: Once | INTRAMUSCULAR | Status: AC
  Administered 2017-02-21: 30 mg via INTRAMUSCULAR

## 2017-02-21 MED ORDER — DEXAMETHASONE SODIUM PHOSPHATE 4 MG/ML IJ SOLN
4.0000 mg | Freq: Once | INTRAMUSCULAR | Status: AC
  Administered 2017-02-21: 4 mg via INTRAMUSCULAR

## 2017-02-21 NOTE — Patient Instructions (Addendum)
- Follow-up with your neurologist to discuss a migraine treatment plan. There are epilepsy medications that also work well for preventing migraines, but you should discuss this with your neurologist.   Migraine Headache A migraine headache is an intense, throbbing pain on one side or both sides of the head. Migraines may also cause other symptoms, such as nausea, vomiting, and sensitivity to light and noise. What are the causes? Doing or taking certain things may also trigger migraines, such as:  Alcohol.  Smoking.  Medicines, such as:  Medicine used to treat chest pain (nitroglycerine).  Birth control pills.  Estrogen pills.  Certain blood pressure medicines.  Aged cheeses, chocolate, or caffeine.  Foods or drinks that contain nitrates, glutamate, aspartame, or tyramine.  Physical activity. Other things that may trigger a migraine include:  Menstruation.  Pregnancy.  Hunger.  Stress, lack of sleep, too much sleep, or fatigue.  Weather changes. What increases the risk? The following factors may make you more likely to experience migraine headaches:  Age. Risk increases with age.  Family history of migraine headaches.  Being Caucasian.  Depression and anxiety.  Obesity.  Being a woman.  Having a hole in the heart (patent foramen ovale) or other heart problems. What are the signs or symptoms? The main symptom of this condition is pulsating or throbbing pain. Pain may:  Happen in any area of the head, such as on one side or both sides.  Interfere with daily activities.  Get worse with physical activity.  Get worse with exposure to bright lights or loud noises. Other symptoms may include:  Nausea.  Vomiting.  Dizziness.  General sensitivity to bright lights, loud noises, or smells. Before you get a migraine, you may get warning signs that a migraine is developing (aura). An aura may include:  Seeing flashing lights or having blind  spots.  Seeing bright spots, halos, or zigzag lines.  Having tunnel vision or blurred vision.  Having numbness or a tingling feeling.  Having trouble talking.  Having muscle weakness. How is this diagnosed? A migraine headache can be diagnosed based on:  Your symptoms.  A physical exam.  Tests, such as CT scan or MRI of the head. These imaging tests can help rule out other causes of headaches.  Taking fluid from the spine (lumbar puncture) and analyzing it (cerebrospinal fluid analysis, or CSF analysis). How is this treated? A migraine headache is usually treated with medicines that:  Relieve pain.  Relieve nausea.  Prevent migraines from coming back. Treatment may also include:  Acupuncture.  Lifestyle changes like avoiding foods that trigger migraines. Follow these instructions at home: Medicines   Take over-the-counter and prescription medicines only as told by your health care provider.  Do not drive or use heavy machinery while taking prescription pain medicine.  To prevent or treat constipation while you are taking prescription pain medicine, your health care provider may recommend that you:  Drink enough fluid to keep your urine clear or pale yellow.  Take over-the-counter or prescription medicines.  Eat foods that are high in fiber, such as fresh fruits and vegetables, whole grains, and beans.  Limit foods that are high in fat and processed sugars, such as fried and sweet foods. Lifestyle   Avoid alcohol use.  Do not use any products that contain nicotine or tobacco, such as cigarettes and e-cigarettes. If you need help quitting, ask your health care provider.  Get at least 8 hours of sleep every night.  Limit your stress.  General instructions    Keep a journal to find out what may trigger your migraine headaches. For example, write down:  What you eat and drink.  How much sleep you get.  Any change to your diet or medicines.  If you have  a migraine:  Avoid things that make your symptoms worse, such as bright lights.  It may help to lie down in a dark, quiet room.  Do not drive or use heavy machinery.  Ask your health care provider what activities are safe for you while you are experiencing symptoms.  Keep all follow-up visits as told by your health care provider. This is important. Contact a health care provider if:  You develop symptoms that are different or more severe than your usual migraine symptoms. Get help right away if:  Your migraine becomes severe.  You have a fever.  You have a stiff neck.  You have vision loss.  Your muscles feel weak or like you cannot control them.  You start to lose your balance often.  You develop trouble walking.  You faint. This information is not intended to replace advice given to you by your health care provider. Make sure you discuss any questions you have with your health care provider. Document Released: 10/11/2005 Document Revised: 04/30/2016 Document Reviewed: 03/29/2016 Elsevier Interactive Patient Education  2017 [employer].

## 2017-02-21 NOTE — Progress Notes (Signed)
HPI:                                                                Bonnie Allen is a 56 y.o. female who presents to [facility]: Primary Care Sports Medicine today for headache  Patient with PMH of partial seizures and migraine presents today with headache Onset: 8:15am today Location: left frontal Duration: constant Character: sharp, 7/10 Associated symptoms: photosensitivity, nausea  Aggravating factors: coughing Treatments: heating pad Pertinent negatives: no vision change, focal weakness, paresthesias, gait disturbance. Patient has not had a seizure since 02/15/17  Patient was seen in urgent care on 02/15/17 for a similar headache. She had a CT head on 02/15/17 that was negative for acute ICA, but did show a spehnoid sinusitis, and minimal chronic small vessel white matter ischemic changes of both cerebral hemispheres thought to be due to chronic migraine. She was treated with decadron and toradol, which she states helped her within an hour. She was also given Cefdinir for her sinus infection. Patient is concerned today that she still has a sinus infection.  Patient is also requesting to have her vitamin B12 level rechecked, which was previously elevated. Patient has been taking an oral B12 supplement for migraine prophylaxis.  Past Medical History:  Diagnosis Date  . Epilepsy [facility])    Past Surgical History:  Procedure Laterality Date  . CESAREAN SECTION    . CHOLECYSTECTOMY    . FOOT SURGERY    . KNEE SURGERY    . uterine ablation     Social History  Substance Use Topics  . Smoking status: Never Smoker  . Smokeless tobacco: Never Used  . Alcohol use 1.8 oz/week    3 Cans of beer per week   family history includes Cancer in her paternal grandfather and paternal grandmother; Drug abuse in her paternal uncle; Hyperlipidemia in her father; Hypertension in her father and mother; Parkinson's disease in her father; Stroke in her mother and paternal  grandmother.  ROS: negative except as noted in the HPI  Medications: Current Outpatient Prescriptions  Medication Sig Dispense Refill  . Calcium Citrate-Vitamin D (CALCIUM + D PO) Take by mouth.    . CloBAZam (ONFI PO) Take 30 mg by mouth 2 (two) times daily.     . Cyanocobalamin (B-12 PO) Take by mouth.    . escitalopram (LEXAPRO) 10 MG tablet Take 10 mg by mouth.    . fluticasone (FLONASE) 50 MCG/ACT nasal spray Place 2 sprays into both nostrils daily. 16 g 2  . LevETIRAcetam (KEPPRA PO) Take 2,250 mg by mouth at bedtime.    . Multiple Vitamin (MULTIVITAMIN) tablet Take 1 tablet by mouth daily.    Marland Kitchen nystatin cream (MYCOSTATIN) Apply 1 application topically 2 (two) times daily. As needed under breast. 30 g 0  . Omega-3 Fatty Acids (FISH OIL PO) Take by mouth.    . valACYclovir (VALTREX) 1000 MG tablet Take 1 tablet (1,000 mg total) by mouth 3 (three) times daily. For 7 day 21 tablet 0  . levofloxacin (LEVAQUIN) 500 MG tablet Take 1 tablet (500 mg total) by mouth daily. 7 tablet 0  . promethazine (PHENERGAN) 12.5 MG tablet Take 1 tablet (12.5 mg total) by mouth every 6 (six) hours as needed for nausea or  vomiting. 20 tablet 0   No current facility-administered medications for this visit.    Allergies  Allergen Reactions  . Azithromycin   . Bactrim [Sulfamethoxazole-Trimethoprim]   . Dilantin [Phenytoin Sodium Extended]   . Doxycycline   . Penicillins   . Sulfa Antibiotics        Objective:  BP 135/88   Pulse 83   Wt 155 lb (70.3 kg)   BMI 31.31 kg/m  Gen: well-groomed, not ill-appearing, no acute distress HEENT: head normocephalic, atraumatic; normal conjunctiva, TM's clear, oropharynx clear, moist mucus membranes Pulm: Normal work of breathing, normal phonation, clear to auscultation bilaterally CV: Normal rate, regular rhythm, s1 and s2 distinct, no murmurs, clicks or rubs Neuro:  cranial nerves II-XII intact, no nystagmus, no papilledema, normal finger-to-nose, normal  heel-to-shin, negative pronator drift, normal coordination, DTR's intact, normal tone, no tremor MSK: strength 5/5 and symmetric in bilateral upper and lower extremities, normal gait and station Mental Status: alert and oriented x 3, normal speech, cooperative, organized thought content     No results found for this or any previous visit (from the past 72 hour(s)). No results found.    Assessment and Plan: 56 y.o. female with   1. Migraine without aura and without status migrainosus, not intractable - migraine cocktail given in clinic today. Patient declined PO phenergan for nausea. - patient is not convinced that her headaches are due to migraine and she would like a referral to ENT for further evaluation of her sinuses. This is reasonable given findings on CT. We also discussed that should ENT work-up be negative, she should follow-up with her neurologist to discuss a migraine prophylaxis plan - dexamethasone (DECADRON) injection 4 mg; Inject 1 mL (4 mg total) into the muscle once. - ketorolac (TORADOL) 30 MG/ML injection 30 mg; Inject 1 mL (30 mg total) into the muscle once. - promethazine (PHENERGAN) 12.5 MG tablet; Take 1 tablet (12.5 mg total) by mouth every 6 (six) hours as needed for nausea or vomiting.  Dispense: 20 tablet; Refill: 0  2. Elevated vitamin B12 level - Vitamin B12  3. Subacute sphenoidal sinusitis - Ambulatory referral to ENT - patient has multiple antibiotic allergies. She states she gets a rash with all of these antibiotics.  - levofloxacin (LEVAQUIN) 500 MG tablet; Take 1 tablet (500 mg total) by mouth daily.  Dispense: 7 tablet; Refill: 0  Patient education and anticipatory guidance given Patient agrees with treatment plan Follow-up as needed if symptoms worsen or fail to improve  Levonne Hubert PA-C

## 2017-02-24 DIAGNOSIS — R7989 Other specified abnormal findings of blood chemistry: Secondary | ICD-10-CM | POA: Insufficient documentation

## 2017-02-24 DIAGNOSIS — Z881 Allergy status to other antibiotic agents status: Secondary | ICD-10-CM | POA: Insufficient documentation

## 2017-02-24 DIAGNOSIS — R748 Abnormal levels of other serum enzymes: Secondary | ICD-10-CM | POA: Insufficient documentation

## 2017-06-13 ENCOUNTER — Encounter: Payer: Self-pay | Admitting: Physician Assistant

## 2017-06-13 ENCOUNTER — Ambulatory Visit (INDEPENDENT_AMBULATORY_CARE_PROVIDER_SITE_OTHER): Payer: BLUE CROSS/BLUE SHIELD | Admitting: Physician Assistant

## 2017-06-13 VITALS — BP 155/91 | HR 69 | Ht 59.0 in | Wt 155.0 lb

## 2017-06-13 DIAGNOSIS — Z881 Allergy status to other antibiotic agents status: Secondary | ICD-10-CM

## 2017-06-13 DIAGNOSIS — R030 Elevated blood-pressure reading, without diagnosis of hypertension: Secondary | ICD-10-CM | POA: Diagnosis not present

## 2017-06-13 DIAGNOSIS — R300 Dysuria: Secondary | ICD-10-CM | POA: Diagnosis not present

## 2017-06-13 DIAGNOSIS — R457 State of emotional shock and stress, unspecified: Secondary | ICD-10-CM

## 2017-06-13 LAB — POCT URINALYSIS DIPSTICK
Bilirubin, UA: NEGATIVE
Blood, UA: NEGATIVE
Glucose, UA: NEGATIVE
Ketones, UA: NEGATIVE
Nitrite, UA: NEGATIVE
Protein, UA: NEGATIVE
Spec Grav, UA: 1.02 (ref 1.010–1.025)
Urobilinogen, UA: 0.2 E.U./dL
pH, UA: 5.5 (ref 5.0–8.0)

## 2017-06-13 MED ORDER — NITROFURANTOIN MONOHYD MACRO 100 MG PO CAPS: 100.0000 mg | ORAL_CAPSULE | Freq: Two times a day (BID) | ORAL | 0 refills | Status: AC

## 2017-06-13 NOTE — Progress Notes (Signed)
HPI:                                                                Bonnie Allen is a 56 y.o. female who presents to [facility]: Primary Care Sports Medicine today for dysuria   Reports urinary hesitancy and dysuria at the end of urination present for 1 week. Patient reports vulva feels swollen and irritated. Denies vaginal itching, rash or discharge. Denies fever, chills, abdominal/pelvic pain, flank pain.   Recently started Yuvafem this month. Denies new sexual partners.   Past Medical History:  Diagnosis Date  . Epilepsy [facility])    Past Surgical History:  Procedure Laterality Date  . CESAREAN SECTION    . CHOLECYSTECTOMY    . FOOT SURGERY    . KNEE SURGERY    . uterine ablation     Social History  Substance Use Topics  . Smoking status: Never Smoker  . Smokeless tobacco: Never Used  . Alcohol use 1.8 oz/week    3 Cans of beer per week   family history includes Cancer in her paternal grandfather and paternal grandmother; Drug abuse in her paternal uncle; Hyperlipidemia in her father; Hypertension in her father and mother; Parkinson's disease in her father; Stroke in her mother and paternal grandmother.  ROS: negative except as noted in the HPI  Medications: Current Outpatient Prescriptions  Medication Sig Dispense Refill  . Calcium Citrate-Vitamin D (CALCIUM + D PO) Take by mouth.    . CloBAZam (ONFI PO) Take 30 mg by mouth 2 (two) times daily.     . Cyanocobalamin (B-12 PO) Take by mouth.    . escitalopram (LEXAPRO) 10 MG tablet Take 10 mg by mouth.    . fluticasone (FLONASE) 50 MCG/ACT nasal spray Place 2 sprays into both nostrils daily. 16 g 2  . LevETIRAcetam (KEPPRA PO) Take 2,250 mg by mouth at bedtime.    . Multiple Vitamin (MULTIVITAMIN) tablet Take 1 tablet by mouth daily.    . nitrofurantoin, macrocrystal-monohydrate, (MACROBID) 100 MG capsule Take 1 capsule (100 mg total) by mouth 2 (two) times daily. 10 capsule 0  . nystatin cream  (MYCOSTATIN) Apply 1 application topically 2 (two) times daily. As needed under breast. 30 g 0  . Omega-3 Fatty Acids (FISH OIL PO) Take by mouth.     No current facility-administered medications for this visit.    Allergies  Allergen Reactions  . Dilantin [Phenytoin Sodium Extended]   . Sulfa Antibiotics   . Azithromycin Rash  . Bactrim [Sulfamethoxazole-Trimethoprim] Rash  . Doxycycline Rash  . Penicillins Rash       Objective:  BP (!) 155/91   Pulse 69   Ht 4\' 11"  (1.499 m)   Wt 155 lb (70.3 kg)   BMI 31.31 kg/m  Gen:  alert, not ill-appearing, no distress, appropriate for age HEENT: head normocephalic without obvious abnormality, conjunctiva and cornea clear, trachea midline Pulm: Normal work of breathing, normal phonation Neuro: alert and oriented x 3, no tremor MSK: extremities atraumatic, normal gait and station Skin: intact, no rashes on exposed skin, no jaundice, no cyanosis    Results for orders placed or performed in visit on 06/13/17 (from the past 72 hour(s))  POCT Urinalysis Dipstick     Status: Abnormal  Collection Time: 06/13/17  4:25 PM  Result Value Ref Range   Color, UA yellow    Clarity, UA slightly cloudy    Glucose, UA negative    Bilirubin, UA negaitve    Ketones, UA negative    Spec Grav, UA 1.020 1.010 - 1.025   Blood, UA negative    pH, UA 5.5 5.0 - 8.0   Protein, UA negative    Urobilinogen, UA 0.2 0.2 or 1.0 E.U./dL   Nitrite, UA negative    Leukocytes, UA Small (1+) (A) Negative   No results found.    Assessment and Plan: 56 y.o. female with   1. Dysuria - POCT Urinalysis Dipstick positive for small leuks. Treating empirically for uncomplicated cystitis - Urine Culture pending - nitrofurantoin, macrocrystal-monohydrate, (MACROBID) 100 MG capsule; Take 1 capsule (100 mg total) by mouth 2 (two) times daily.  Dispense: 10 capsule; Refill: 0  2. Elevated blood pressure reading BP Readings from Last 3 Encounters:  06/13/17 (!)  155/91  02/21/17 135/88  02/15/17 140/86  - asymptomatic, patient reports increased stress and receiving bad news today - appears patient has hypertension based on previous readings - recommend follow-up with PCP in 1 week   3. Allergy to multiple antibiotics - reviewed allergies and reactions  4. Caregiver stress syndrome - reports taking Lexapro for stress. This was prescribed by neurology. States she is having very low libido with this. Recommend follow-up with PCP or Neuro to discuss Viibryd or Trintellix  Patient education and anticipatory guidance given Patient agrees with treatment plan Follow-up as needed if symptoms worsen or fail to improve  Levonne Hubert PA-C

## 2017-06-13 NOTE — Patient Instructions (Signed)
- Macrobid twice a day for 5 days - OTC Azo as needed for burning/urgency   Urinary Tract Infection, Adult A urinary tract infection (UTI) is an infection of any part of the urinary tract, which includes the kidneys, ureters, bladder, and urethra. These organs make, store, and get rid of urine in the body. UTI can be a bladder infection (cystitis) or kidney infection (pyelonephritis). What are the causes? This infection may be caused by fungi, viruses, or bacteria. Bacteria are the most common cause of UTIs. This condition can also be caused by repeated incomplete emptying of the bladder during urination. What increases the risk? This condition is more likely to develop if:  You ignore your need to urinate or hold urine for long periods of time.  You do not empty your bladder completely during urination.  You wipe back to front after urinating or having a bowel movement, if you are female.  You are uncircumcised, if you are female.  You are constipated.  You have a urinary catheter that stays in place (indwelling).  You have a weak defense (immune) system.  You have a medical condition that affects your bowels, kidneys, or bladder.  You have diabetes.  You take antibiotic medicines frequently or for long periods of time, and the antibiotics no longer work well against certain types of infections (antibiotic resistance).  You take medicines that irritate your urinary tract.  You are exposed to chemicals that irritate your urinary tract.  You are female.  What are the signs or symptoms? Symptoms of this condition include:  Fever.  Frequent urination or passing small amounts of urine frequently.  Needing to urinate urgently.  Pain or burning with urination.  Urine that smells bad or unusual.  Cloudy urine.  Pain in the lower abdomen or back.  Trouble urinating.  Blood in the urine.  Vomiting or being less hungry than normal.  Diarrhea or abdominal  pain.  Vaginal discharge, if you are female.  How is this diagnosed? This condition is diagnosed with a medical history and physical exam. You will also need to provide a urine sample to test your urine. Other tests may be done, including:  Blood tests.  Sexually transmitted disease (STD) testing.  If you have had more than one UTI, a cystoscopy or imaging studies may be done to determine the cause of the infections. How is this treated? Treatment for this condition often includes a combination of two or more of the following:  Antibiotic medicine.  Other medicines to treat less common causes of UTI.  Over-the-counter medicines to treat pain.  Drinking enough water to stay hydrated.  Follow these instructions at home:  Take over-the-counter and prescription medicines only as told by your health care provider.  If you were prescribed an antibiotic, take it as told by your health care provider. Do not stop taking the antibiotic even if you start to feel better.  Avoid alcohol, caffeine, tea, and carbonated beverages. They can irritate your bladder.  Drink enough fluid to keep your urine clear or pale yellow.  Keep all follow-up visits as told by your health care provider. This is important.  Make sure to: ? Empty your bladder often and completely. Do not hold urine for long periods of time. ? Empty your bladder before and after sex. ? Wipe from front to back after a bowel movement if you are female. Use each tissue one time when you wipe. Contact a health care provider if:  You have back  pain.  You have a fever.  You feel nauseous or vomit.  Your symptoms do not get better after 3 days.  Your symptoms go away and then return. Get help right away if:  You have severe back pain or lower abdominal pain.  You are vomiting and cannot keep down any medicines or water. This information is not intended to replace advice given to you by your health care provider. Make sure  you discuss any questions you have with your health care provider. Document Released: 07/21/2005 Document Revised: 03/24/2016 Document Reviewed: 09/01/2015 Elsevier Interactive Patient Education  2017 [employer].

## 2017-06-14 LAB — URINE CULTURE: Organism ID, Bacteria: NO GROWTH

## 2017-06-15 NOTE — Progress Notes (Signed)
Good morning Bonnie Allen,  Your urine culture was negative. If you are still have symptoms, it is okay to finish the antibiotic. If symptoms persist, please schedule a follow-up appointment with your PCP, Tandy Gaw PA-C.  Best, Vinetta Bergamo

## 2017-06-30 ENCOUNTER — Encounter: Payer: Self-pay | Admitting: Physician Assistant

## 2017-06-30 ENCOUNTER — Ambulatory Visit (INDEPENDENT_AMBULATORY_CARE_PROVIDER_SITE_OTHER): Payer: BLUE CROSS/BLUE SHIELD | Admitting: Physician Assistant

## 2017-06-30 ENCOUNTER — Ambulatory Visit (INDEPENDENT_AMBULATORY_CARE_PROVIDER_SITE_OTHER): Payer: BLUE CROSS/BLUE SHIELD

## 2017-06-30 VITALS — BP 101/70 | HR 65 | Wt 157.0 lb

## 2017-06-30 DIAGNOSIS — M79644 Pain in right finger(s): Secondary | ICD-10-CM

## 2017-06-30 MED ORDER — MELOXICAM 15 MG PO TABS: 15.0000 mg | ORAL_TABLET | Freq: Every day | ORAL | 0 refills | Status: DC

## 2017-06-30 NOTE — Patient Instructions (Addendum)
X-rays today Meloxicam 1 tab daily for 2 weeks, then once daily as needed Follow-up with PCP or Sports Medicine in 4 weeks

## 2017-06-30 NOTE — Progress Notes (Signed)
HPI:                                                                Bonnie Allen is a 56 y.o. female who presents to [facility]: Primary Care Sports Medicine today for right thumb pain  Patient reports tenderness and swelling of the right thumb at the interphalangeal joint x 3 months. Pain is moderate, persistent, gradually worsening. Denies catching sensation. Denies known injury or trauma. Denies redness or warmth overlying the joint. 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  Past Medical History:  Diagnosis Date  . Epilepsy [facility])    Past Surgical History:  Procedure Laterality Date  . CESAREAN SECTION    . CHOLECYSTECTOMY    . FOOT SURGERY    . KNEE SURGERY    . uterine ablation     Social History  Substance Use Topics  . Smoking status: Never Smoker  . Smokeless tobacco: Never Used  . Alcohol use 1.8 oz/week    3 Cans of beer per week   family history includes Cancer in her paternal grandfather and paternal grandmother; Drug abuse in her paternal uncle; Hyperlipidemia in her father; Hypertension in her father and mother; Parkinson's disease in her father; Stroke in her mother and paternal grandmother.  ROS: negative except as noted in the HPI  Medications: Current Outpatient Prescriptions  Medication Sig Dispense Refill  . Calcium Citrate-Vitamin D (CALCIUM + D PO) Take by mouth.    . CloBAZam (ONFI PO) Take 30 mg by mouth 2 (two) times daily.     . Cyanocobalamin (B-12 PO) Take by mouth.    . LevETIRAcetam (KEPPRA PO) Take 2,250 mg by mouth at bedtime.    . Multiple Vitamin (MULTIVITAMIN) tablet Take 1 tablet by mouth daily.    . Omega-3 Fatty Acids (FISH OIL PO) Take by mouth.    . escitalopram (LEXAPRO) 10 MG tablet Take 10 mg by mouth.     No current facility-administered medications for this visit.    Allergies  Allergen Reactions  . Dilantin [Phenytoin Sodium Extended]   . Sulfa Antibiotics   . Azithromycin  Rash  . Bactrim [Sulfamethoxazole-Trimethoprim] Rash  . Doxycycline Rash  . Penicillins Rash       Objective:  BP 101/70   Pulse 65   Wt 157 lb (71.2 kg)   BMI 31.71 kg/m  Gen:  alert, not ill-appearing, no distress, appropriate for age HEENT: head normocephalic without obvious abnormality, conjunctiva and cornea clear, trachea midline Pulm: Normal work of breathing, normal phonation Neuro: alert and oriented x 3, no tremor MSK: right thumb without obvious deformity, there is mild swelling at the IP joint without overlying erythema or warmth, tenderness at the IP joint, pain reproduced with flexion of the IP joint, MCP and CMC nontender, full active ROM, IP, MCP and CMC are stable, strength intact, negative pinch grip test, negative Finkelsteins's test, negative Tinel's  Skin: intact, no rashes on exposed skin, no jaundice, no cyanosis   No results found for this or any previous visit (from the past 72 hour(s)). No results found.    Assessment and Plan: 56 y.o. female with   1. Thumb pain, right - Nontraumatic thumb pain with swelling and tenderness of the  IP joint. Suspect OA/degenerative changes.  - DG Finger Thumb Right - meloxicam (MOBIC) 15 MG tablet; Take 1 tablet (15 mg total) by mouth daily.  Dispense: 30 tablet; Refill: 0     Patient education and anticipatory guidance given Patient agrees with treatment plan Follow-up with PCP or Sports Medicine in 4 weeks  Levonne Hubertharley E. Cummings PA-C

## 2017-07-01 NOTE — Progress Notes (Signed)
X-ray was interpreted as normal. Consulted Sports Medicine, who feel there is evidence of some arthritis. Recommend follow-up with PCP or Sports in 4 weeks or sooner if needed

## 2017-09-20 ENCOUNTER — Ambulatory Visit: Payer: BLUE CROSS/BLUE SHIELD | Admitting: Physician Assistant

## 2017-09-22 ENCOUNTER — Encounter: Payer: Self-pay | Admitting: Family Medicine

## 2017-09-22 ENCOUNTER — Ambulatory Visit (INDEPENDENT_AMBULATORY_CARE_PROVIDER_SITE_OTHER): Payer: BLUE CROSS/BLUE SHIELD | Admitting: Family Medicine

## 2017-09-22 VITALS — BP 128/66 | HR 62 | Ht 59.0 in | Wt 149.0 lb

## 2017-09-22 DIAGNOSIS — G40909 Epilepsy, unspecified, not intractable, without status epilepticus: Secondary | ICD-10-CM

## 2017-09-22 DIAGNOSIS — R457 State of emotional shock and stress, unspecified: Secondary | ICD-10-CM | POA: Diagnosis not present

## 2017-09-22 DIAGNOSIS — R21 Rash and other nonspecific skin eruption: Secondary | ICD-10-CM

## 2017-09-22 DIAGNOSIS — N952 Postmenopausal atrophic vaginitis: Secondary | ICD-10-CM | POA: Insufficient documentation

## 2017-09-22 MED ORDER — CLOBETASOL PROPIONATE 0.05 % EX CREA: 1.0000 "application " | TOPICAL_CREAM | Freq: Every day | CUTANEOUS | 0 refills | Status: DC

## 2017-09-22 NOTE — Progress Notes (Signed)
Subjective:    Patient ID: Bonnie Allen, female    DOB: 05-06-1961, 56 y.o.   MRN: [id number]  HPI  56 year old female comes in today complaining of a rash on her upper chest and left distal forearm for abo ut 6 weeks. .  Had blisters initially on her hands about 3 weeks ago but that resolved.  Had similar rash a year ago and saw Dermatology and was given triamcinolone cream. Says has been using that again but not helping.  She has been more stressed recently with her father's failing health.  He has advanced parkinsons disease.  And over the last year he has declined significantly.  He is now in a nursing home but has been combative and so they had to find a new place for him to stay.  She is also dealing with her aging mother who is having difficulty adjusting to her husband medical condition.  Seizure disorder-she is now gone 6 months as of tomorrow being seizure-free.  She was at the 5053-month mark last year when she actually had 3 seizures back to back.  They felt like it was stress-induced and put her on Lexapro.  She has been on that ever since and has done well with it.  No side effects and she does feel like it is helpful.   Review of Systems   BP 128/66   Pulse 62   Ht 4\' 11"  (1.499 m)   Wt 149 lb (67.6 kg)   SpO2 100%   BMI 30.09 kg/m     Allergies  Allergen Reactions  . Dilantin [Phenytoin Sodium Extended]   . Sulfa Antibiotics   . Azithromycin Rash  . Bactrim [Sulfamethoxazole-Trimethoprim] Rash  . Doxycycline Rash  . Penicillins Rash    Past Medical History:  Diagnosis Date  . Epilepsy [facility](HCC)     Past Surgical History:  Procedure Laterality Date  . CESAREAN SECTION    . CHOLECYSTECTOMY    . FOOT SURGERY    . KNEE SURGERY    . uterine ablation      Social History   Socioeconomic History  . Marital status: Married    Spouse name: Not on file  . Number of children: Not on file  . Years of education: Not on file  . Highest education level: Not on file   Social Needs  . Financial resource strain: Not on file  . Food insecurity - worry: Not on file  . Food insecurity - inability: Not on file  . Transportation needs - medical: Not on file  . Transportation needs - non-medical: Not on file  Occupational History  . Not on file  Tobacco Use  . Smoking status: Never Smoker  . Smokeless tobacco: Never Used  Substance and Sexual Activity  . Alcohol use: Yes    Alcohol/week: 1.8 oz    Types: 3 Cans of beer per week  . Drug use: No  . Sexual activity: Yes  Other Topics Concern  . Not on file  Social History Narrative  . Not on file    Family History  Problem Relation Age of Onset  . Stroke Mother   . Hypertension Mother   . Hypertension Father   . Parkinson's disease Father   . Hyperlipidemia Father   . Drug abuse Paternal Uncle   . Cancer Paternal Grandmother        lung  . Stroke Paternal Grandmother   . Cancer Paternal Grandfather        lung  Outpatient Encounter Medications as of 09/22/2017  Medication Sig  . escitalopram (LEXAPRO) 10 MG tablet Take 10 mg by mouth daily.  . Calcium Citrate-Vitamin D (CALCIUM + D PO) Take by mouth.  . CloBAZam (ONFI PO) Take 30 mg by mouth 2 (two) times daily.   . clobetasol cream (TEMOVATE) 0.05 % Apply 1 application topically daily.  . LevETIRAcetam (KEPPRA PO) Take 2,250 mg by mouth at bedtime.  . Multiple Vitamin (MULTIVITAMIN) tablet Take 1 tablet by mouth daily.  . Omega-3 Fatty Acids (FISH OIL PO) Take by mouth.  . [DISCONTINUED] Cyanocobalamin (B-12 PO) Take by mouth.  . [DISCONTINUED] escitalopram (LEXAPRO) 10 MG tablet Take 10 mg by mouth.  . [DISCONTINUED] meloxicam (MOBIC) 15 MG tablet Take 1 tablet (15 mg total) by mouth daily.   No facility-administered encounter medications on file as of 09/22/2017.           Objective:   Physical Exam  Constitutional: She is oriented to person, place, and time. She appears well-developed and well-nourished.  HENT:  Head:  Normocephalic and atraumatic.  Eyes: Conjunctivae and EOM are normal.  Cardiovascular: Normal rate.  Pulmonary/Chest: Effort normal.  Her upper anterior chest she has about 5 or 6 scattered lesions that are erythematous and papular with a little bit of scale.  Some of them are just round and papular and some almost have a circular half doughnut type shape to them.  She has a few erythematous papules on the upper back just near the shoulders and a couple near her left forearm distally 1 of which does have a donut-shaped with some central clearing.  Neurological: She is alert and oriented to person, place, and time.  Skin: Skin is dry. No pallor.  Psychiatric: She has a normal mood and affect. Her behavior is normal.  Vitals reviewed.       Assessment & Plan:  Rash -consistent with what looks like nummular eczema they are not responding very well to the topical triamcinolone.  She has been using it once a day for about 3 weeks without significant improvement that she has a few lesions that look like they are drying up.  Care Giver Stress Syndrome -just gave her encouragement.  It does sound like she has some support in her family particularly with her brother.  Continue Lexapro for now.  Seizure disorder-most seizure-free for 6 months.  She is continue to work on getting adequate sleep and rest and lowering stress levels. She is not driving.

## 2017-09-22 NOTE — Patient Instructions (Addendum)

## 2017-11-18 ENCOUNTER — Ambulatory Visit (INDEPENDENT_AMBULATORY_CARE_PROVIDER_SITE_OTHER): Payer: BLUE CROSS/BLUE SHIELD | Admitting: Physician Assistant

## 2017-11-18 ENCOUNTER — Encounter: Payer: Self-pay | Admitting: Physician Assistant

## 2017-11-18 VITALS — BP 92/56 | HR 70 | Ht 59.0 in | Wt 148.0 lb

## 2017-11-18 DIAGNOSIS — L300 Nummular dermatitis: Secondary | ICD-10-CM | POA: Diagnosis not present

## 2017-11-18 DIAGNOSIS — Z131 Encounter for screening for diabetes mellitus: Secondary | ICD-10-CM | POA: Diagnosis not present

## 2017-11-18 DIAGNOSIS — Z13220 Encounter for screening for lipoid disorders: Secondary | ICD-10-CM

## 2017-11-18 DIAGNOSIS — R5383 Other fatigue: Secondary | ICD-10-CM

## 2017-11-18 DIAGNOSIS — Z0000 Encounter for general adult medical examination without abnormal findings: Secondary | ICD-10-CM | POA: Diagnosis not present

## 2017-11-18 MED ORDER — PREDNISONE 20 MG PO TABS: ORAL_TABLET | ORAL | 0 refills | Status: DC

## 2017-11-18 NOTE — Patient Instructions (Signed)

## 2017-11-18 NOTE — Progress Notes (Addendum)
Subjective:    Patient ID: Bonnie Allen, female    DOB: May 05, 1961, 57 y.o.   MRN: [id number]  HPI  Pt is a 57 year old female with a history of a seizure disorder and nummular eczema presenting to the clinic for her CPE.   She reports that she was previously doing well with diet and exercise until her father's health condition declined in the recent months. He passed away on October 24, 2017 and she has had lots of stress with organizing the funeral and loss of a parent. She has lost 10 pounds since September.   Rash: She was seen by Dr. Linford ArnoldMetheney in November for nummular eczema which started in October with involvement of her chest and arms. She has been taking clabetasol off and on which showed benefit but she did stop taking it regularly and the rash returned. The rash has now spread to her back now and is across her chest. She reports intermittent urticaria. She reports this causes some distress due to it being visible when she wears v cut shirts and occasionally puts make up over them to cover up the rash.    Seizures: Her seizures are managed by a neurologist with Onfi and Lexapro but she had some concerns because her insurance will now only cover generic clobazam starting this year. She wasn't sure if this had anything to do with her rash that appears to have worsened. She reports she has had two seizures in the December (Dec 5 and Dec 30 - nighttime) .  Patient has colonoscopy scheduled in April. She has had her pap smears with her OBGYN at [facility].   Review of Systems  Constitutional: Negative for chills, diaphoresis, fatigue and fever.  Respiratory: Negative for cough and shortness of breath.   Cardiovascular: Negative for chest pain and palpitations.  Gastrointestinal: Positive for constipation and diarrhea. Negative for nausea and vomiting.  Neurological: Positive for headaches.       Objective:   Physical Exam  BP (!) 92/56   Pulse 70   Ht 4\' 11"  (1.499 m)   Wt 148  lb (67.1 kg)   BMI 29.89 kg/m   General Appearance:    Alert, cooperative, no distress, appears stated age  Head:    Normocephalic, without obvious abnormality, atraumatic  Eyes:    PERRL, conjunctiva/corneas clear, EOM's intact, fundi    benign, both eyes  Ears:    Normal TM's and external ear canals, both ears  Nose:   Nares normal, septum midline, mucosa normal, no drainage    or sinus tenderness  Throat:   Lips, mucosa, and tongue normal; teeth and gums normal  Neck:   Supple, symmetrical, trachea midline, no adenopathy;    thyroid:  no enlargement/tenderness/nodules; no carotid   bruit or JVD  Back:     Symmetric, no curvature, ROM normal, no CVA tenderness  Lungs:     Clear to auscultation bilaterally, respirations unlabored  Chest Wall:    No tenderness or deformity   Heart:    Regular rate and rhythm, S1 and S2 normal, no murmur, rub   or gallop     Abdomen:     Soft, non-tender, bowel sounds active all four quadrants,    no masses, no organomegaly        Extremities:   Extremities normal, atraumatic, no cyanosis or edema  Pulses:   2+ and symmetric all extremities  Skin:   Skin dry, no pallor Scattered discoid erythematous, papular lesions with  a little bit of scale present on the chest, lower neck, and upper back.   Lymph nodes:   Cervical, supraclavicular, and axillary nodes normal  Neurologic:   CNII-XII grossly intact, normal strength, sensation and reflexes    throughout       Assessment & Plan:  Sahithi was seen today for annual exam and rash.  Diagnoses and all orders for this visit:  Routine physical examination -     Lipid Panel w/reflex Direct LDL -     COMPLETE METABOLIC PANEL WITH GFR -     TSH -     Vitamin D 1,25 dihydroxy -     B12 -     Hepatitis C antibody  Screening for lipid disorders -     Lipid Panel w/reflex Direct LDL  Screening for diabetes mellitus -     COMPLETE METABOLIC PANEL WITH GFR  No energy -     TSH -     Vitamin D 1,25  dihydroxy -     B12  Nummular eczema -     predniSONE (DELTASONE) 20 MG tablet; Take one tablet twice a day for 5 days.  .. Depression screen [place] 2/9 11/18/2017 11/24/2016  Decreased Interest 0 0  Down, Depressed, Hopeless 0 0  PHQ - 2 Score 0 0   .Marland Kitchen Discussed 150 minutes of exercise a week.  Encouraged vitamin D 1000 units and Calcium 1300mg  or 4 servings of dairy a day.   Bonnie Allen was given a handout on advice for staying healthy. She will be getting routine labs today and has been educated on the Shingrix vaccination for her considerations. Her rash had some response to clobetasol but she has been given a prescription for a burst of oral steroids to try to decrease inflammation and resolve her rash. She has been educated that she should avoid scented lotions and keep her skin moisturized in addition to once daily clabestasol  If she has no relief or has a recurrence of the rash she is encouraged to make an appointment with dermatology for further treatment.  She can follow up in one year for her annual or as needed for any issues that come up.

## 2017-11-19 ENCOUNTER — Encounter: Payer: Self-pay | Admitting: Physician Assistant

## 2017-11-23 LAB — COMPLETE METABOLIC PANEL WITH GFR
AG Ratio: 2.3 (calc) (ref 1.0–2.5)
ALT: 14 U/L (ref 6–29)
AST: 12 U/L (ref 10–35)
Albumin: 4.5 g/dL (ref 3.6–5.1)
Alkaline phosphatase (APISO): 61 U/L (ref 33–130)
BUN: 13 mg/dL (ref 7–25)
CO2: 29 mmol/L (ref 20–32)
Calcium: 9.8 mg/dL (ref 8.6–10.4)
Chloride: 102 mmol/L (ref 98–110)
Creat: 0.74 mg/dL (ref 0.50–1.05)
GFR, Est African American: 103 mL/min/{1.73_m2} (ref >=60)
GFR, Est Non African American: 89 mL/min/{1.73_m2} (ref >=60)
Globulin: 2 g/dL (calc) (ref 1.9–3.7)
Glucose, Bld: 74 mg/dL (ref 65–99)
Potassium: 4.2 mmol/L (ref 3.5–5.3)
Sodium: 138 mmol/L (ref 135–146)
Total Bilirubin: 0.3 mg/dL (ref 0.2–1.2)
Total Protein: 6.5 g/dL (ref 6.1–8.1)

## 2017-11-23 LAB — VITAMIN D 1,25 DIHYDROXY
Vitamin D 1, 25 (OH)2 Total: 35 pg/mL (ref 18–72)
Vitamin D2 1, 25 (OH)2: <8 pg/mL
Vitamin D3 1, 25 (OH)2: 35 pg/mL

## 2017-11-23 LAB — TSH: TSH: 0.94 mIU/L (ref 0.40–4.50)

## 2017-11-23 LAB — LIPID PANEL W/REFLEX DIRECT LDL
Cholesterol: 223 mg/dL — ABNORMAL HIGH (ref <200)
HDL: 52 mg/dL (ref >50)
LDL Cholesterol (Calc): 141 mg/dL (calc) — ABNORMAL HIGH
Non-HDL Cholesterol (Calc): 171 mg/dL (calc) — ABNORMAL HIGH (ref <130)
Total CHOL/HDL Ratio: 4.3 (calc) (ref <5.0)
Triglycerides: 166 mg/dL — ABNORMAL HIGH (ref <150)

## 2017-11-23 LAB — HEPATITIS C ANTIBODY
Hepatitis C Ab: NONREACTIVE
SIGNAL TO CUT-OFF: 0 (ref <1.00)

## 2017-11-23 LAB — VITAMIN B12: Vitamin B-12: 889 pg/mL (ref 200–1100)

## 2017-11-25 ENCOUNTER — Encounter: Payer: Self-pay | Admitting: Physician Assistant

## 2017-11-27 ENCOUNTER — Encounter: Payer: Self-pay | Admitting: Physician Assistant

## 2017-12-07 ENCOUNTER — Encounter: Payer: Self-pay | Admitting: Physician Assistant

## 2018-10-14 ENCOUNTER — Emergency Department
Admission: EM | Admit: 2018-10-14 | Discharge: 2018-10-14 | Disposition: A | Discharge status: Home / Self Care | Payer: BLUE CROSS/BLUE SHIELD | Source: Home / Self Care

## 2018-10-14 ENCOUNTER — Other Ambulatory Visit: Payer: Self-pay

## 2018-10-14 DIAGNOSIS — J029 Acute pharyngitis, unspecified: Secondary | ICD-10-CM

## 2018-10-14 LAB — POCT RAPID STREP A (OFFICE): Rapid Strep A Screen: NEGATIVE

## 2018-10-14 NOTE — ED Provider Notes (Signed)
[facility]    CSN: [id number] Arrival date & time: 10/14/18  0940     History   Chief Complaint Chief Complaint  Patient presents with  . Sore Throat    HPI Bonnie Allen is a 57 y.o. female.   HPI Pleasant lady with history of having a sore throat since Wednesday.  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  She was not able to sing in the last couple of numbers.  She ran a temperature last night up to 102.  She has noticed swollen glands.  She has had a little head congestion and postnasal drainage with mild cough from that.  Some ear pain.  She does not smoke.  Not get a lot of respiratory tract infections.  Has not yet had a flu shot, because she had a cold and the timing was not right. Past Medical History:  Diagnosis Date  . Epilepsy [facility](HCC)     Patient Active Problem List   Diagnosis Date Noted  . Nummular eczema 11/18/2017  . Atrophic vaginitis 09/22/2017  . Thumb pain, right 06/30/2017  . Elevated blood pressure reading 06/13/2017  . Caregiver stress syndrome 06/13/2017  . Elevated vitamin B12 level 02/24/2017  . Allergy to multiple antibiotics 02/24/2017  . Osteopenia 02/21/2017  . Migraine without aura and without status migrainosus, not intractable 02/21/2017  . Herpes zoster without complication 02/12/2017  . Hyperlipidemia LDL goal <130 11/26/2016  . OSA on CPAP 11/24/2016  . Seizure disorder (HCC) 11/24/2016    Past Surgical History:  Procedure Laterality Date  . CESAREAN SECTION    . CHOLECYSTECTOMY    . FOOT SURGERY    . KNEE SURGERY    . uterine ablation      OB History   No obstetric history on file.      Home Medications    Prior to Admission medications   Medication Sig Start Date End Date Taking? Authorizing Provider  Calcium Citrate-Vitamin D (CALCIUM + D PO) Take by mouth.    [provider]  CloBAZam (ONFI PO) Take 30 mg by mouth 2  (two) times daily.     [provider]  clobetasol cream (TEMOVATE) 0.05 % Apply 1 application topically daily. 09/22/17   Agapito GamesMetheney, Catherine D, MD  escitalopram (LEXAPRO) 10 MG tablet Take 10 mg by mouth daily. 09/02/17   [provider]  LevETIRAcetam (KEPPRA PO) Take 2,250 mg by mouth at bedtime.    [provider]  Multiple Vitamin (MULTIVITAMIN) tablet Take 1 tablet by mouth daily.    [provider]  Omega-3 Fatty Acids (FISH OIL PO) Take by mouth.    [provider]  predniSONE (DELTASONE) 20 MG tablet Take one tablet twice a day for 5 days. 11/18/17   Breeback, Lesly RubensteinJade L, PA-C  Probiotic Product (PROBIOTIC DAILY PO) Take by mouth.    [provider]    Family History Family History  Problem Relation Age of Onset  . Stroke Mother   . Hypertension Mother   . Hypertension Father   . Parkinson's disease Father   . Hyperlipidemia Father   . Drug abuse Paternal Uncle   . Cancer Paternal Grandmother        lung  . Stroke Paternal Grandmother   . Cancer Paternal Grandfather        lung    Social History Social History   Tobacco Use  .  Smoking status: Never Smoker  . Smokeless tobacco: Never Used  Substance Use Topics  . Alcohol use: Yes    Alcohol/week: 3.0 standard drinks    Types: 3 Cans of beer per week  . Drug use: No     Allergies   Dilantin [phenytoin sodium extended]; Sulfa antibiotics; Azithromycin; Bactrim [sulfamethoxazole-trimethoprim]; Doxycycline; and Penicillins   Review of Systems Review of Systems As above.  No GI complaints.  Does not feel well.  Physical Exam Triage Vital Signs ED Triage Vitals [10/14/18 1018]  Enc Vitals Group     BP 132/82     Pulse Rate 88     Resp      Temp 98.4 F (36.9 C)     Temp Source Oral     SpO2 96 %     Weight 150 lb (68 kg)     Height 4\' 11"  (1.499 m)     Head Circumference      Peak Flow      Pain Score 0     Pain Loc      Pain Edu?      Excl. in GC?      No data found.  Updated Vital Signs BP 132/82 (BP Location: Right Arm)   Pulse 88   Temp 98.4 F (36.9 C) (Oral)   Ht 4\' 11"  (1.499 m)   Wt 68 kg   SpO2 96%   BMI 30.30 kg/m   Visual Acuity Right Eye Distance:   Left Eye Distance:   Bilateral Distance:    Right Eye Near:   Left Eye Near:    Bilateral Near:     Physical Exam Pleasant lady, alert and oriented.  Left TM normal.  Right TM has a little erythema along the superior brim.  Her landmarks are good.  Throat was erythematous with a little exudate on the right tonsil.  Mildly edematous appearance.  Neck supple with small anterior cervical nodes.  Chest is clear to auscultation.  Heart regular without murmurs.  No rashes.  UC Treatments / Results  Labs (all labs ordered are listed, but only abnormal results are displayed) Labs Reviewed  STREP A DNA PROBE  POCT RAPID STREP A (OFFICE)   Rapid strep test negative. EKG None  Radiology No results found.  Procedures Procedures (including critical care time)  Medications Ordered in UC Medications - No data to display  Initial Impression / Assessment and Plan / UC Course  I have reviewed the triage vital signs and the nursing notes.  Pertinent labs & imaging results that were available during my care of the patient were reviewed by me and considered in my medical decision making (see chart for details).     Viral pharyngitis.  Treat accordingly. Final Clinical Impressions(s) / UC Diagnoses   Final diagnoses:  Acute pharyngitis, unspecified etiology     Discharge Instructions     The strep test is negative.  This is probably a viral pharyngitis.   Take Tylenol 500 mg (acetaminophen) 2 pills 3 times daily and/or ibuprofen 200 mg 2 or 3 pills 3 times daily as needed for fever and aching.  Drink lots of fluids  Treat symptomatically, and return if further problems.  We will let you know if the full culture happens to grow strep, but I do not think it is  going to.  I urge you to get the flu shot.    ED Prescriptions    None     Controlled Substance Prescriptions [place]  Controlled Substance Registry consulted? No   Peyton Najjar, MD 10/14/18 1045

## 2018-10-14 NOTE — Discharge Instructions (Addendum)
The strep test is negative.  This is probably a viral pharyngitis.   Take Tylenol 500 mg (acetaminophen) 2 pills 3 times daily and/or ibuprofen 200 mg 2 or 3 pills 3 times daily as needed for fever and aching.  Drink lots of fluids  Treat symptomatically, and return if further problems.  We will let you know if the full culture happens to grow strep, but I do not think it is going to.  I urge you to get the flu shot.

## 2018-10-14 NOTE — ED Triage Notes (Signed)
Pt c/o sore throat since Wed. Had fever of 102 this am. Took ibuprofen at 830am. Also feeling very fatigued.

## 2018-10-16 ENCOUNTER — Telehealth: Payer: Self-pay | Admitting: Emergency Medicine

## 2018-10-16 LAB — STREP A DNA PROBE: Group A Strep Probe: NOT DETECTED

## 2018-10-16 NOTE — Telephone Encounter (Signed)
Spoke with patient; she is not feeling much better but following advice; gave her negative strep dna results.

## 2018-11-08 ENCOUNTER — Encounter: Payer: Self-pay | Admitting: Physician Assistant

## 2018-11-08 ENCOUNTER — Ambulatory Visit: Payer: 59 | Admitting: Physician Assistant

## 2018-11-08 VITALS — BP 123/74 | HR 63 | Temp 98.6°F | Ht 59.0 in | Wt 153.0 lb

## 2018-11-08 DIAGNOSIS — J40 Bronchitis, not specified as acute or chronic: Secondary | ICD-10-CM | POA: Diagnosis not present

## 2018-11-08 DIAGNOSIS — J329 Chronic sinusitis, unspecified: Secondary | ICD-10-CM

## 2018-11-08 MED ORDER — CEFDINIR 300 MG PO CAPS: 300.0000 mg | ORAL_CAPSULE | Freq: Two times a day (BID) | ORAL | 0 refills | Status: DC

## 2018-11-08 MED ORDER — ALBUTEROL SULFATE HFA 108 (90 BASE) MCG/ACT IN AERS: 2.0000 | INHALATION_SPRAY | Freq: Four times a day (QID) | RESPIRATORY_TRACT | 0 refills | Status: DC | PRN

## 2018-11-08 MED ORDER — BENZONATATE 200 MG PO CAPS: 200.0000 mg | ORAL_CAPSULE | Freq: Two times a day (BID) | ORAL | 0 refills | Status: DC | PRN

## 2018-11-08 NOTE — Patient Instructions (Signed)
Acute Bronchitis, Adult Acute bronchitis is when air tubes (bronchi) in the lungs suddenly get swollen. The condition can make it hard to breathe. It can also cause these symptoms:  A cough.  Coughing up clear, yellow, or green mucus.  Wheezing.  Chest congestion.  Shortness of breath.  A fever.  Body aches.  Chills.  A sore throat. Follow these instructions at home:  Medicines  Take over-the-counter and prescription medicines only as told by your doctor.  If you were prescribed an antibiotic medicine, take it as told by your doctor. Do not stop taking the antibiotic even if you start to feel better. General instructions  Rest.  Drink enough fluids to keep your pee (urine) pale yellow.  Avoid smoking and secondhand smoke. If you smoke and you need help quitting, ask your doctor. Quitting will help your lungs heal faster.  Use an inhaler, cool mist vaporizer, or humidifier as told by your doctor.  Keep all follow-up visits as told by your doctor. This is important. How is this prevented? To lower your risk of getting this condition again:  Wash your hands often with soap and water. If you cannot use soap and water, use hand sanitizer.  Avoid contact with people who have cold symptoms.  Try not to touch your hands to your mouth, nose, or eyes.  Make sure to get the flu shot every year. Contact a doctor if:  Your symptoms do not get better in 2 weeks. Get help right away if:  You cough up blood.  You have chest pain.  You have very bad shortness of breath.  You become dehydrated.  You faint (pass out) or keep feeling like you are going to pass out.  You keep throwing up (vomiting).  You have a very bad headache.  Your fever or chills gets worse. This information is not intended to replace advice given to you by your health care provider. Make sure you discuss any questions you have with your health care provider. Document Released: 03/29/2008 Document  Revised: 05/25/2017 Document Reviewed: 03/31/2016 Elsevier Interactive Patient Education  2019 Elsevier Inc.  

## 2018-11-08 NOTE — Progress Notes (Signed)
   Subjective:    Patient ID: Bonnie Allen, female    DOB: [birth year], 58 y.o.   MRN: [id number]  HPI  Pt is a 58 yo female who presents to the clinic with cough, sinus pressure, congestion and "just not getting better". She was seen in UC on 10/14/18 with severe ST. Negative for strep and treated symptomatically. Her throat has gotten better but other symptoms have worsened. She is taking claritin D and nasonex with initial improvement but not resolving symptoms. She is coughing and blowing greenish sputum. No recent fever, chills, body aches. Her chest does feel a little tight.   .. Active Ambulatory Problems    Diagnosis Date Noted  . OSA on CPAP 11/24/2016  . Seizure disorder (HCC) 11/24/2016  . Hyperlipidemia LDL goal <130 11/26/2016  . Herpes zoster without complication 02/12/2017  . Osteopenia 02/21/2017  . Migraine without aura and without status migrainosus, not intractable 02/21/2017  . Elevated vitamin B12 level 02/24/2017  . Allergy to multiple antibiotics 02/24/2017  . Elevated blood pressure reading 06/13/2017  . Caregiver stress syndrome 06/13/2017  . Thumb pain, right 06/30/2017  . Atrophic vaginitis 09/22/2017  . Nummular eczema 11/18/2017   Resolved Ambulatory Problems    Diagnosis Date Noted  . Intertrigo 02/12/2017  . GERD (gastroesophageal reflux disease) 02/21/2017   Past Medical History:  Diagnosis Date  . Epilepsy (HCC)      Review of Systems    see HPI.  Objective:   Physical Exam Vitals signs reviewed.  Constitutional:      Appearance: Normal appearance.  HENT:     Head: Normocephalic and atraumatic.     Right Ear: Tympanic membrane and ear canal normal.     Left Ear: Tympanic membrane and ear canal normal.     Nose: Nose normal. No congestion.     Mouth/Throat:     Pharynx: Posterior oropharyngeal erythema present. No oropharyngeal exudate.  Eyes:     Conjunctiva/sclera: Conjunctivae normal.  Cardiovascular:     Rate and Rhythm: Normal  rate and regular rhythm.     Pulses: Normal pulses.  Pulmonary:     Effort: Pulmonary effort is normal.     Breath sounds: Normal breath sounds.     Comments: Left lower lung expiratory wheeze.  Neurological:     General: No focal deficit present.     Mental Status: She is alert and oriented to person, place, and time.  Psychiatric:        Mood and Affect: Mood normal.        Behavior: Behavior normal.           Assessment & Plan:  Marland KitchenMarland KitchenEmanie was seen today for cough.  Diagnoses and all orders for this visit:  Sinobronchitis -     cefdinir (OMNICEF) 300 MG capsule; Take 1 capsule (300 mg total) by mouth 2 (two) times daily. For 10 days. -     benzonatate (TESSALON) 200 MG capsule; Take 1 capsule (200 mg total) by mouth 2 (two) times daily as needed for cough. -     albuterol (PROVENTIL HFA;VENTOLIN HFA) 108 (90 Base) MCG/ACT inhaler; Inhale 2 puffs into the lungs every 6 (six) hours as needed for wheezing or shortness of breath.  symptoms for over 2 week. Treated with abx, tessalon, albuterol since I did hear some wheezing over left lung. Rest and hydrate. Consider humidifier. Follow up as needed.

## 2018-11-10 ENCOUNTER — Encounter: Payer: Self-pay | Admitting: Physician Assistant

## 2019-03-05 ENCOUNTER — Encounter: Payer: Self-pay | Admitting: Physician Assistant

## 2019-03-05 ENCOUNTER — Ambulatory Visit: Payer: 59

## 2019-03-05 ENCOUNTER — Other Ambulatory Visit: Payer: Self-pay

## 2019-03-05 ENCOUNTER — Ambulatory Visit: Payer: 59 | Admitting: Physician Assistant

## 2019-03-05 VITALS — BP 122/79 | HR 72 | Temp 97.7°F | Ht 58.5 in | Wt 160.0 lb

## 2019-03-05 DIAGNOSIS — M79604 Pain in right leg: Secondary | ICD-10-CM | POA: Diagnosis not present

## 2019-03-05 DIAGNOSIS — M7061 Trochanteric bursitis, right hip: Secondary | ICD-10-CM | POA: Diagnosis not present

## 2019-03-05 DIAGNOSIS — S7011XA Contusion of right thigh, initial encounter: Secondary | ICD-10-CM

## 2019-03-05 DIAGNOSIS — M7989 Other specified soft tissue disorders: Secondary | ICD-10-CM

## 2019-03-05 DIAGNOSIS — M79605 Pain in left leg: Secondary | ICD-10-CM | POA: Insufficient documentation

## 2019-03-05 NOTE — Progress Notes (Signed)
Subjective:    Patient ID: Bonnie Allen, female    DOB: [birth year], 58 y.o.   MRN: [id number]  HPI  Pt is a 58 yo female who presents to the clinic with right leg bruise, swelling, pain for 5 days. She was gardening late last week for about 4 hours straight. She had no injury but the next morning woke up with pain in the right leg area and slowly this bruise began to form. She denies any SOB or trouble walking. Right thigh/hip very painful. Not done a whole lot to make better. Husband wanted her checked out.   .. Active Ambulatory Problems    Diagnosis Date Noted  . OSA on CPAP 11/24/2016  . Seizure disorder (HCC) 11/24/2016  . Hyperlipidemia LDL goal <130 11/26/2016  . Herpes zoster without complication 02/12/2017  . Osteopenia 02/21/2017  . Migraine without aura and without status migrainosus, not intractable 02/21/2017  . Elevated vitamin B12 level 02/24/2017  . Allergy to multiple antibiotics 02/24/2017  . Elevated blood pressure reading 06/13/2017  . Caregiver stress syndrome 06/13/2017  . Thumb pain, right 06/30/2017  . Atrophic vaginitis 09/22/2017  . Nummular eczema 11/18/2017  . Left leg pain 03/05/2019  . Contusion of right thigh 03/07/2019  . Right leg swelling 03/07/2019  . Discoid lupus erythematosus 03/07/2019  . Trochanteric bursitis of right hip 03/07/2019   Resolved Ambulatory Problems    Diagnosis Date Noted  . Intertrigo 02/12/2017  . GERD (gastroesophageal reflux disease) 02/21/2017   Past Medical History:  Diagnosis Date  . Epilepsy (HCC)   . Lupus (HCC)        Review of Systems  All other systems reviewed and are negative.      Objective:   Physical Exam Vitals signs reviewed.  Constitutional:      Appearance: Normal appearance.  HENT:     Head: Normocephalic.  Cardiovascular:     Rate and Rhythm: Normal rate and regular rhythm.     Pulses: Normal pulses.  Pulmonary:     Effort: Pulmonary effort is normal.     Breath sounds: Normal  breath sounds.  Musculoskeletal:     Comments: ROM of right hip pain with internal more than external. Tenderness to palpation over greater trochanter.  Normal strength.   Skin:    Comments: Large bruise over right lateral thigh extending 23inches by 15inches. Tender to palpation.    Thigh 53inches(right), 52inches (left)  Neurological:     General: No focal deficit present.     Mental Status: She is alert and oriented to person, place, and time.  Psychiatric:        Mood and Affect: Mood normal.        Behavior: Behavior normal.           Assessment & Plan:  Marland KitchenMarland KitchenTerrianna was seen today for hip pain.  Diagnoses and all orders for this visit:  Contusion of right thigh, initial encounter -     US Venous Img Lower Unilateral Right  Right leg pain -     US Venous Img Lower Unilateral Right  Right leg swelling -     US Venous Img Lower Unilateral Right  Trochanteric bursitis of right hip   Risk for DVT is low but due to extensive bruising and swelling will get venous dopper. STAT doppler negative for clot.   Reassured patient about bruising. ICE, rest, NSAID. I do think has some burisitis as well. Given exercises to start. If no improvement consider injection.

## 2019-03-05 NOTE — Patient Instructions (Signed)
Hip Bursitis  Hip bursitis is swelling of a fluid-filled sac (bursa) in your hip. This swelling (inflammation) can be painful. This condition may come and go over time. Follow these instructions at home: Medicines  Take over-the-counter and prescription medicines only as told by your doctor.  Do not drive or use heavy machinery while taking prescription pain medicine, or as told by your doctor.  If you were prescribed an antibiotic medicine, take it as told by your doctor. Do not stop taking the antibiotic even if you start to feel better. Activity  Return to your normal activities as told by your doctor. Ask your doctor what activities are safe for you.  Rest and protect your hip until you feel better. General instructions  Wear wraps that put pressure on your hip (compression wraps) only as told by your doctor.  Raise (elevate) your hip above the level of your heart as much as you can. To do this, try putting a pillow under your hips while you lie down. Stop if this causes pain.  Do not use your hip to support your body weight until your doctor says that you can.  Use crutches as told by your doctor.  Gently rub and stretch your injured area as often as is comfortable.  Keep all follow-up visits as told by your doctor. This is important. How is this prevented?  Exercise regularly, as told by your doctor.  Warm up and stretch before being active.  Cool down and stretch after being active.  Avoid activities that bother your hip or cause pain.  Avoid sitting down for long periods at a time. Contact a doctor if:  You have a fever.  You get new symptoms.  You have trouble walking.  You have trouble doing everyday activities.  You have pain that gets worse.  You have pain that does not get better with medicine.  You get red skin on your hip area.  You get a feeling of warmth in your hip area. Get help right away if:  You cannot move your hip.  You have very  bad pain. This information is not intended to replace advice given to you by your health care provider. Make sure you discuss any questions you have with your health care provider. Document Released: 11/13/2010 Document Revised: 03/18/2016 Document Reviewed: 05/13/2015 Elsevier Interactive Patient Education  2019 Elsevier Inc.  

## 2019-03-06 ENCOUNTER — Other Ambulatory Visit: Payer: Self-pay

## 2019-03-06 ENCOUNTER — Ambulatory Visit: Payer: 59

## 2019-03-06 ENCOUNTER — Encounter: Payer: Self-pay | Admitting: Physician Assistant

## 2019-03-06 NOTE — Progress Notes (Signed)
Great news. No DVT. Ice, rest, ibuprofen 800mg  2-3 times a day. Let me know if not improving.

## 2019-03-07 DIAGNOSIS — M7989 Other specified soft tissue disorders: Secondary | ICD-10-CM | POA: Insufficient documentation

## 2019-03-07 DIAGNOSIS — S7011XA Contusion of right thigh, initial encounter: Secondary | ICD-10-CM | POA: Insufficient documentation

## 2019-03-07 DIAGNOSIS — M7061 Trochanteric bursitis, right hip: Secondary | ICD-10-CM | POA: Insufficient documentation

## 2019-03-07 DIAGNOSIS — L930 Discoid lupus erythematosus: Secondary | ICD-10-CM | POA: Insufficient documentation

## 2019-08-14 ENCOUNTER — Ambulatory Visit (INDEPENDENT_AMBULATORY_CARE_PROVIDER_SITE_OTHER): Payer: 59

## 2019-08-14 ENCOUNTER — Ambulatory Visit (INDEPENDENT_AMBULATORY_CARE_PROVIDER_SITE_OTHER): Payer: 59 | Admitting: Physician Assistant

## 2019-08-14 ENCOUNTER — Other Ambulatory Visit: Payer: Self-pay

## 2019-08-14 VITALS — BP 133/83 | HR 72 | Ht 59.0 in | Wt 164.0 lb

## 2019-08-14 DIAGNOSIS — M25561 Pain in right knee: Secondary | ICD-10-CM | POA: Diagnosis not present

## 2019-08-14 LAB — HM MAMMOGRAPHY

## 2019-08-14 NOTE — Progress Notes (Signed)
   Subjective:    Patient ID: Bonnie Allen, female    DOB: [birth year], 58 y.o.   MRN: [id number]  HPI  Pt is a 58 yo female who presents to the clinic with right knee pain. She has started exercising more and lifting weights. She admits her right knee pain started months ago when she was doing squats. She stopped doing squats and got a little better. She did a recent activity about 1.5 weeks ago and was jumping. On the 3rd jump she came down and heard a "crunch". She iced, rest and started ibuprofen. It is better but it is achy and feels "loose" inside her right knee. She is not able to provide flexion like the left knee. She continues to walk and went on a hike yesterday.   .. Active Ambulatory Problems    Diagnosis Date Noted  . OSA on CPAP 11/24/2016  . Seizure disorder ([place]) 11/24/2016  . Hyperlipidemia LDL goal <130 11/26/2016  . Herpes zoster without complication 38/75/6433  . Osteopenia 02/21/2017  . Migraine without aura and without status migrainosus, not intractable 02/21/2017  . Elevated vitamin B12 level 02/24/2017  . Allergy to multiple antibiotics 02/24/2017  . Elevated blood pressure reading 06/13/2017  . Caregiver stress syndrome 06/13/2017  . Thumb pain, right 06/30/2017  . Atrophic vaginitis 09/22/2017  . Nummular eczema 11/18/2017  . Left leg pain 03/05/2019  . Contusion of right thigh 03/07/2019  . Right leg swelling 03/07/2019  . Discoid lupus erythematosus 03/07/2019  . Trochanteric bursitis of right hip 03/07/2019   Resolved Ambulatory Problems    Diagnosis Date Noted  . Intertrigo 02/12/2017  . GERD (gastroesophageal reflux disease) 02/21/2017   Past Medical History:  Diagnosis Date  . Epilepsy ([place])   . Lupus ([place])     Review of Systems    see HPI.  Objective:   Physical Exam Vitals signs reviewed.  Constitutional:      Appearance: Normal appearance.  Cardiovascular:     Rate and Rhythm: Normal rate and regular rhythm.     Pulses: Normal  pulses.  Pulmonary:     Effort: Pulmonary effort is normal.     Breath sounds: Normal breath sounds.  Musculoskeletal:     Comments: Right knee: No significant swelling. Strength 5/5.  ROM decreased flexion due to discomfort able to get to 110 degrees. No tenderness in joint space.  Tenderness to palpation around patella and medial and lateral epicondyle.  Negative anterior drawer. Negative mcmurrays.  Neurological:     General: No focal deficit present.     Mental Status: She is alert and oriented to person, place, and time.  Psychiatric:        Mood and Affect: Mood normal.           Assessment & Plan:  Marland KitchenMarland KitchenKayda was seen today for knee pain.  Diagnoses and all orders for this visit:  Acute pain of right knee -     DG Knee 4 Views W/Patella Right   Will get xray.  Suspect some patellofemoral pain/syndrome. Exercises given to strength quads. Continue to use rest, ice, compression, elevation.  Consider knee sleeve for stability when exercising.  Avoid squats/stairs/running for now.

## 2019-08-14 NOTE — Patient Instructions (Signed)
Patellofemoral Pain Syndrome  Patellofemoral pain syndrome is a condition in which the tissue (cartilage) on the underside of the kneecap (patella) softens or breaks down. This causes pain in the front of the knee. The condition is also called runner's knee or chondromalacia patella. Patellofemoral pain syndrome is most common in young adults who are active in sports. The knee is the largest joint in the body. The patella covers the front of the knee and is attached to muscles above and below the knee. The underside of the patella is covered with a smooth type of cartilage (synovium). The smooth surface helps the patella to glide easily when you move your knee. Patellofemoral pain syndrome causes swelling in the joint linings and bone surfaces in the knee. What are the causes? This condition may be caused by:  Overuse of the knee.  Poor alignment of your knee joints.  Weak leg muscles.  A direct blow to your kneecap. What increases the risk? You are more likely to develop this condition if:  You do a lot of activities that can wear down your kneecap. These include: ? Running. ? Squatting. ? Climbing stairs.  You start a new physical activity or exercise program.  You wear shoes that do not fit well.  You do not have good leg strength.  You are overweight. What are the signs or symptoms? The main symptom of this condition is knee pain. This may feel like a dull, aching pain underneath your patella, in the front of your knee. There may be a popping or cracking sound when you move your knee. Pain may get worse with:  Exercise.  Climbing stairs.  Running.  Jumping.  Squatting.  Kneeling.  Sitting for a long time.  Moving or pushing on your patella. How is this diagnosed? This condition may be diagnosed based on:  Your symptoms and medical history. You may be asked about your recent physical activities and which ones cause knee pain.  A physical exam. This may include:  ? Moving your patella back and forth. ? Checking your range of knee motion. ? Having you squat or jump to see if you have pain. ? Checking the strength of your leg muscles.  Imaging tests to confirm the diagnosis. These may include an MRI of your knee. How is this treated? This condition may be treated at home with rest, ice, compression, and elevation (RICE).  Other treatments may include:  Nonsteroidal anti-inflammatory drugs (NSAIDs).  Physical therapy to stretch and strengthen your leg muscles.  Shoe inserts (orthotics) to take stress off your knee.  A knee brace or knee support.  Adhesive tapes to the skin.  Surgery to remove damaged cartilage or move the patella to a better position. This is rare. Follow these instructions at home: If you have a shoe or brace:  Wear the shoe or brace as told by your health care provider. Remove it only as told by your health care provider.  Loosen the shoe or brace if your toes tingle, become numb, or turn cold and blue.  Keep the shoe or brace clean.  If the shoe or brace is not waterproof: ? Do not let it get wet. ? Cover it with a watertight covering when you take a bath or a shower. Managing pain, stiffness, and swelling  If directed, put ice on the painful area. ? If you have a removable shoe or brace, remove it as told by your health care provider. ? Put ice in a plastic bag. ?   Place a towel between your skin and the bag. ? Leave the ice on for 20 minutes, 2-3 times a day.  Move your toes often to avoid stiffness and to lessen swelling.  Rest your knee: ? Avoid activities that cause knee pain. ? When sitting or lying down, raise (elevate) the injured area above the level of your heart, whenever possible. General instructions  Take over-the-counter and prescription medicines only as told by your health care provider.  Use splints, braces, knee supports, or walking aids as directed by your health care provider.  Perform  stretching and strengthening exercises as told by your health care provider or physical therapist.  Do not use any products that contain nicotine or tobacco, such as cigarettes and e-cigarettes. These can delay healing. If you need help quitting, ask your health care provider.  Return to your normal activities as told by your health care provider. Ask your health care provider what activities are safe for you.  Keep all follow-up visits as told by your health care provider. This is important. Contact a health care provider if:  Your symptoms get worse.  You are not improving with home care. Summary  Patellofemoral pain syndrome is a condition in which the tissue (cartilage) on the underside of the kneecap (patella) softens or breaks down.  This condition causes swelling in the joint linings and bone surfaces in the knee. This leads to pain in the front of the knee.  This condition may be treated at home with rest, ice, compression, and elevation (RICE).  Use splints, braces, knee supports, or walking aids as directed by your health care provider. This information is not intended to replace advice given to you by your health care provider. Make sure you discuss any questions you have with your health care provider. Document Released: 09/29/2009 Document Revised: 11/21/2017 Document Reviewed: 11/21/2017 Elsevier Patient Education  2020 [employer]  

## 2019-08-15 ENCOUNTER — Encounter: Payer: Self-pay | Admitting: Physician Assistant

## 2019-08-15 NOTE — Progress Notes (Signed)
Do you think this patella tilt needs surgical intervention or try 2 weeks of PT working on quad strength and follow up with you?

## 2019-08-15 NOTE — Progress Notes (Signed)
Ok great. I had suspected patellofemoral syndrome and already gave her exercises. I will have her do this and offer formal PT and then have her follow up with you in 2-3 weeks!

## 2019-08-15 NOTE — Progress Notes (Signed)
Please call patient and let her know her patella is tilted likely causing some patellofemoral pain. She has home exercises she can do. I think formal PT could also be helpful. Would she like that? In 2-3 weeks if not improving follow up with Dr. Darene Lamer in office and he could consider injections etc.

## 2019-09-06 ENCOUNTER — Encounter: Payer: Self-pay | Admitting: Physician Assistant

## 2019-10-22 ENCOUNTER — Telehealth: Payer: Self-pay

## 2019-10-22 DIAGNOSIS — L930 Discoid lupus erythematosus: Secondary | ICD-10-CM

## 2019-10-22 NOTE — Telephone Encounter (Signed)
Pt called and LVM requesting a referral to Rheumatology. She has a dx of discoid lupus erythematosus and states her Neurologist told her she needed to a Rheumatologist and not a Paediatric nurse.

## 2019-10-23 NOTE — Telephone Encounter (Signed)
Let her know I am fine with referral but dermatology and rheumatology both overlap in treatment with that condition.

## 2019-10-23 NOTE — Telephone Encounter (Signed)
Patient aware that the referral to Rheumatology has been placed and Jade's comment regarding Rheumatologist vs. Dermatologist.

## 2019-10-29 ENCOUNTER — Encounter: Payer: Self-pay | Admitting: Physician Assistant

## 2019-10-31 ENCOUNTER — Encounter: Payer: Self-pay | Admitting: Physician Assistant

## 2019-11-20 ENCOUNTER — Encounter: Payer: 59 | Admitting: Physician Assistant

## 2019-11-21 ENCOUNTER — Ambulatory Visit (INDEPENDENT_AMBULATORY_CARE_PROVIDER_SITE_OTHER): Payer: 59 | Admitting: Physician Assistant

## 2019-11-21 ENCOUNTER — Telehealth: Payer: Self-pay | Admitting: Neurology

## 2019-11-21 ENCOUNTER — Other Ambulatory Visit: Payer: Self-pay

## 2019-11-21 VITALS — BP 123/76 | HR 71 | Ht 58.5 in | Wt 150.0 lb

## 2019-11-21 DIAGNOSIS — Z1211 Encounter for screening for malignant neoplasm of colon: Secondary | ICD-10-CM

## 2019-11-21 DIAGNOSIS — Z131 Encounter for screening for diabetes mellitus: Secondary | ICD-10-CM

## 2019-11-21 DIAGNOSIS — Z20822 Contact with and (suspected) exposure to covid-19: Secondary | ICD-10-CM

## 2019-11-21 DIAGNOSIS — M25561 Pain in right knee: Secondary | ICD-10-CM | POA: Diagnosis not present

## 2019-11-21 DIAGNOSIS — Z0000 Encounter for general adult medical examination without abnormal findings: Secondary | ICD-10-CM

## 2019-11-21 DIAGNOSIS — M222X1 Patellofemoral disorders, right knee: Secondary | ICD-10-CM

## 2019-11-21 DIAGNOSIS — R4589 Other symptoms and signs involving emotional state: Secondary | ICD-10-CM

## 2019-11-21 DIAGNOSIS — Z13220 Encounter for screening for lipoid disorders: Secondary | ICD-10-CM

## 2019-11-21 DIAGNOSIS — G8929 Other chronic pain: Secondary | ICD-10-CM

## 2019-11-21 DIAGNOSIS — Z23 Encounter for immunization: Secondary | ICD-10-CM | POA: Diagnosis not present

## 2019-11-21 MED ORDER — ESCITALOPRAM OXALATE 10 MG PO TABS: 10.0000 mg | ORAL_TABLET | Freq: Every day | ORAL | 1 refills | Status: DC

## 2019-11-21 NOTE — Progress Notes (Signed)
Subjective:     Bonnie Allen is a 59 y.o. female and is here for a comprehensive physical exam. The patient reports problems - see below.    Pt is having a few problems. She started having seizures again back in December. She is now having 2 a day. She is on keppra. She has neurologist and has appt for feb 8th. She has not stop taking medication. She has been losing weight. She has lost 14lbs since October. She is using nutrisystem. She also did start plaquenil back after being off of it due to lupus like rash. She has dermatologist and appt with rheumatologist as well.   She continues to have right knee pain. She is not doing exercises. She has modified her exercise not to exacerbate. She is using voltaren gel and wearing a brace when she cans.  voltaren gel.   Family had covid but she did not. Wants to know if have antibodies. covid exposure early December.    Social History   Socioeconomic History  . Marital status: Married    Spouse name: Not on file  . Number of children: Not on file  . Years of education: Not on file  . Highest education level: Not on file  Occupational History  . Not on file  Tobacco Use  . Smoking status: Never Smoker  . Smokeless tobacco: Never Used  Substance and Sexual Activity  . Alcohol use: Yes    Alcohol/week: 3.0 standard drinks    Types: 3 Cans of beer per week  . Drug use: No  . Sexual activity: Yes  Other Topics Concern  . Not on file  Social History Narrative  . Not on file   Social Determinants of Health   Financial Resource Strain:   . Difficulty of Paying Living Expenses: Not on file  Food Insecurity:   . Worried About Programme researcher, broadcasting/film/video in the Last Year: Not on file  . Ran Out of Food in the Last Year: Not on file  Transportation Needs:   . Lack of Transportation (Medical): Not on file  . Lack of Transportation (Non-Medical): Not on file  Physical Activity:   . Days of Exercise per Week: Not on file  . Minutes of Exercise per  Session: Not on file  Stress:   . Feeling of Stress : Not on file  Social Connections:   . Frequency of Communication with Friends and Family: Not on file  . Frequency of Social Gatherings with Friends and Family: Not on file  . Attends Religious Services: Not on file  . Active Member of Clubs or Organizations: Not on file  . Attends Banker Meetings: Not on file  . Marital Status: Not on file  Intimate Partner Violence:   . Fear of Current or Ex-Partner: Not on file  . Emotionally Abused: Not on file  . Physically Abused: Not on file  . Sexually Abused: Not on file   Health Maintenance  Topic Date Due  . COLONOSCOPY  11/20/2020 (Originally 05/13/2009)  . HIV Screening  11/20/2020 (Originally [birth year]/1974)  . PAP SMEAR-Modifier  02/13/2021  . MAMMOGRAM  08/13/2021  . TETANUS/TDAP  11/18/2025  . INFLUENZA VACCINE  Completed  . Hepatitis C Screening  Completed    The following portions of the patient's history were reviewed and updated as appropriate: allergies, current medications, past family history, past medical history, past social history, past surgical history and problem list.  Review of Systems Pertinent items noted in HPI and  remainder of comprehensive ROS otherwise negative.   Objective:    BP 123/76   Pulse 71   Ht 4' 10.5" (1.486 m)   Wt 150 lb (68 kg)   SpO2 98%   BMI 30.82 kg/m  General appearance: alert and cooperative Head: Normocephalic, without obvious abnormality, atraumatic Eyes: conjunctivae/corneas clear. PERRL, EOM's intact. Fundi benign. Ears: normal TM's and external ear canals both ears Nose: Nares normal. Septum midline. Mucosa normal. No drainage or sinus tenderness. Throat: lips, mucosa, and tongue normal; teeth and gums normal Neck: no adenopathy, no carotid bruit, no JVD, supple, symmetrical, trachea midline and thyroid not enlarged, symmetric, no tenderness/mass/nodules Back: symmetric, no curvature. ROM normal. No CVA  tenderness. Lungs: clear to auscultation bilaterally Heart: regular rate and rhythm, S1, S2 normal, no murmur, click, rub or gallop Abdomen: soft, non-tender; bowel sounds normal; no masses,  no organomegaly Extremities: extremities normal, atraumatic, no cyanosis or edema Pulses: 2+ and symmetric Skin: lupus rash- macular erythema with outline of blistered scales in clusters on chest and neck.  Lymph nodes: Cervical, supraclavicular, and axillary nodes normal. Neurologic: Alert and oriented X 3, normal strength and tone. Normal symmetric reflexes. Normal coordination and gait    Assessment:    Healthy female exam.      Plan:    Marland KitchenMarland KitchenZaina was seen today for annual exam.  Diagnoses and all orders for this visit:  Routine physical examination -     Lipid Panel w/reflex Direct LDL -     COMPLETE METABOLIC PANEL WITH GFR -     TSH -     SAR CoV2 Serology (COVID 19)AB(IGG)IA  Chronic pain of right knee  Screening for diabetes mellitus -     COMPLETE METABOLIC PANEL WITH GFR  Close exposure to COVID-19 virus -     SAR CoV2 Serology (COVID 19)AB(IGG)IA  Screening for lipid disorders -     Lipid Panel w/reflex Direct LDL  Colon cancer screening -     Cologuard  Need for shingles vaccine -     Varicella-zoster vaccine IM  Depressed mood -     escitalopram (LEXAPRO) 10 MG tablet; Take 1 tablet (10 mg total) by mouth daily.   .. Depression screen [facility] 2/9 11/21/2019 08/14/2019 11/18/2017 11/24/2016  Decreased Interest 1 0 0 0  Down, Depressed, Hopeless 1 0 0 0  PHQ - 2 Score 2 0 0 0  Altered sleeping 1 1 - -  Tired, decreased energy 1 1 - -  Change in appetite 0 0 - -  Feeling bad or failure about yourself  1 1 - -  Trouble concentrating 0 0 - -  Moving slowly or fidgety/restless 0 0 - -  Suicidal thoughts 0 0 - -  PHQ-9 Score 5 3 - -  Difficult doing work/chores Somewhat difficult Somewhat difficult - -   .Marland Kitchen GAD 7 : Generalized Anxiety Score 11/21/2019 08/14/2019   Nervous, Anxious, on Edge 1 0  Control/stop worrying 0 0  Worry too much - different things 1 0  Trouble relaxing 1 1  Restless 0 0  Easily annoyed or irritable 1 1  Afraid - awful might happen 0 0  Total GAD 7 Score 4 2  Anxiety Difficulty Not difficult at all Somewhat difficult    .Marland Kitchen Discussed 150 minutes of exercise a week.  Encouraged vitamin D 1000 units and Calcium 1300mg  or 4 servings of dairy a day.  Overall just more down. Started lexapro.  Pap UTD. Ordered cologuard. Attempted colonoscopy  and had seizures morning before with having to displace time of keppra.  Flu and pneumonia vaccine UTD.  First dose shingrix given. Next dose 2-72months.  covid antibodies ordered.   Right knee-xray showed patellar tilt. Will get in with PT and would like for her to see Dr. Karie Schwalbe. Continue with brace. Ice. Walk but no squats/stairs/lunges.   Unclear etiology of seizures. Has neuro appt. Concerned about nutrisystem being cause or restart of plaquenil. Discussed with neurology. Do not drive.   Rash/lupus seeing rheumatology.  See After Visit Summary for Counseling Recommendations

## 2019-11-21 NOTE — Telephone Encounter (Signed)
Cologuard order faxed to [phone] with confirmation received. They will contact the patient directly.   

## 2019-11-21 NOTE — Patient Instructions (Addendum)
Will get PT>  Sent cologuard. Come back for next shingrix in 2-6 months.   Health Maintenance, Female Adopting a healthy lifestyle and getting preventive care are important in promoting health and wellness. Ask your health care provider about:  The right schedule for you to have regular tests and exams.  Things you can do on your own to prevent diseases and keep yourself healthy. What should I know about diet, weight, and exercise? Eat a healthy diet   Eat a diet that includes plenty of vegetables, fruits, low-fat dairy products, and lean protein.  Do not eat a lot of foods that are high in solid fats, added sugars, or sodium. Maintain a healthy weight Body mass index (BMI) is used to identify weight problems. It estimates body fat based on height and weight. Your health care provider can help determine your BMI and help you achieve or maintain a healthy weight. Get regular exercise Get regular exercise. This is one of the most important things you can do for your health. Most adults should:  Exercise for at least 150 minutes each week. The exercise should increase your heart rate and make you sweat (moderate-intensity exercise).  Do strengthening exercises at least twice a week. This is in addition to the moderate-intensity exercise.  Spend less time sitting. Even light physical activity can be beneficial. Watch cholesterol and blood lipids Have your blood tested for lipids and cholesterol at 61 years of age, then have this test every 5 years. Have your cholesterol levels checked more often if:  Your lipid or cholesterol levels are high.  You are older than 61 years of age.  You are at high risk for heart disease. What should I know about cancer screening? Depending on your health history and family history, you may need to have cancer screening at various ages. This may include screening for:  Breast cancer.  Cervical cancer.  Colorectal cancer.  Skin cancer.  Lung  cancer. What should I know about heart disease, diabetes, and high blood pressure? Blood pressure and heart disease  High blood pressure causes heart disease and increases the risk of stroke. This is more likely to develop in people who have high blood pressure readings, are of African descent, or are overweight.  Have your blood pressure checked: ? Every 3-5 years if you are 63-34 years of age. ? Every year if you are 39 years old or older. Diabetes Have regular diabetes screenings. This checks your fasting blood sugar level. Have the screening done:  Once every three years after age 9 if you are at a normal weight and have a low risk for diabetes.  More often and at a younger age if you are overweight or have a high risk for diabetes. What should I know about preventing infection? Hepatitis B If you have a higher risk for hepatitis B, you should be screened for this virus. Talk with your health care provider to find out if you are at risk for hepatitis B infection. Hepatitis C Testing is recommended for:  Everyone born from [birth year] through 1967.  Anyone with known risk factors for hepatitis C. Sexually transmitted infections (STIs)  Get screened for STIs, including gonorrhea and chlamydia, if: ? You are sexually active and are younger than 61 years of age. ? You are older than 61 years of age and your health care provider tells you that you are at risk for this type of infection. ? Your sexual activity has changed since you were last screened,  and you are at increased risk for chlamydia or gonorrhea. Ask your health care provider if you are at risk.  Ask your health care provider about whether you are at high risk for HIV. Your health care provider may recommend a prescription medicine to help prevent HIV infection. If you choose to take medicine to prevent HIV, you should first get tested for HIV. You should then be tested every 3 months for as long as you are taking the  medicine. Pregnancy  If you are about to stop having your period (premenopausal) and you may become pregnant, seek counseling before you get pregnant.  Take 400 to 800 micrograms (mcg) of folic acid every day if you become pregnant.  Ask for birth control (contraception) if you want to prevent pregnancy. Osteoporosis and menopause Osteoporosis is a disease in which the bones lose minerals and strength with aging. This can result in bone fractures. If you are 43 years old or older, or if you are at risk for osteoporosis and fractures, ask your health care provider if you should:  Be screened for bone loss.  Take a calcium or vitamin D supplement to lower your risk of fractures.  Be given hormone replacement therapy (HRT) to treat symptoms of menopause. Follow these instructions at home: Lifestyle  Do not use any products that contain nicotine or tobacco, such as cigarettes, e-cigarettes, and chewing tobacco. If you need help quitting, ask your health care provider.  Do not use street drugs.  Do not share needles.  Ask your health care provider for help if you need support or information about quitting drugs. Alcohol use  Do not drink alcohol if: ? Your health care provider tells you not to drink. ? You are pregnant, may be pregnant, or are planning to become pregnant.  If you drink alcohol: ? Limit how much you use to 0-1 drink a day. ? Limit intake if you are breastfeeding.  Be aware of how much alcohol is in your drink. In the [place] one drink equals one 12 oz bottle of beer (355 mL), one 5 oz glass of wine (148 mL), or one 1 oz glass of hard liquor (44 mL). General instructions  Schedule regular health, dental, and eye exams.  Stay current with your vaccines.  Tell your health care provider if: ? You often feel depressed. ? You have ever been abused or do not feel safe at home. Summary  Adopting a healthy lifestyle and getting preventive care are important in  promoting health and wellness.  Follow your health care provider's instructions about healthy diet, exercising, and getting tested or screened for diseases.  Follow your health care provider's instructions on monitoring your cholesterol and blood pressure. This information is not intended to replace advice given to you by your health care provider. Make sure you discuss any questions you have with your health care provider. Document Revised: 10/04/2018 Document Reviewed: 10/04/2018 Elsevier Patient Education  2020 [employer].

## 2019-11-22 LAB — COMPLETE METABOLIC PANEL WITH GFR
AG Ratio: 3.2 (calc) — ABNORMAL HIGH (ref 1.0–2.5)
ALT: 16 U/L (ref 6–29)
AST: 14 U/L (ref 10–35)
Albumin: 4.8 g/dL (ref 3.6–5.1)
Alkaline phosphatase (APISO): 64 U/L (ref 37–153)
BUN: 10 mg/dL (ref 7–25)
CO2: 28 mmol/L (ref 20–32)
Calcium: 10.2 mg/dL (ref 8.6–10.4)
Chloride: 99 mmol/L (ref 98–110)
Creat: 0.66 mg/dL (ref 0.50–0.99)
GFR, Est African American: 111 mL/min/{1.73_m2} (ref >=60)
GFR, Est Non African American: 96 mL/min/{1.73_m2} (ref >=60)
Globulin: 1.5 g/dL (calc) — ABNORMAL LOW (ref 1.9–3.7)
Glucose, Bld: 86 mg/dL (ref 65–99)
Potassium: 4.4 mmol/L (ref 3.5–5.3)
Sodium: 137 mmol/L (ref 135–146)
Total Bilirubin: 0.4 mg/dL (ref 0.2–1.2)
Total Protein: 6.3 g/dL (ref 6.1–8.1)

## 2019-11-22 LAB — TSH: TSH: 0.96 mIU/L (ref 0.40–4.50)

## 2019-11-22 LAB — LIPID PANEL W/REFLEX DIRECT LDL
Cholesterol: 200 mg/dL — ABNORMAL HIGH (ref <200)
HDL: 64 mg/dL (ref >=50)
LDL Cholesterol (Calc): 117 mg/dL (calc) — ABNORMAL HIGH
Non-HDL Cholesterol (Calc): 136 mg/dL (calc) — ABNORMAL HIGH (ref <130)
Total CHOL/HDL Ratio: 3.1 (calc) (ref <5.0)
Triglycerides: 93 mg/dL (ref <150)

## 2019-11-22 LAB — SAR COV2 SEROLOGY (COVID19)AB(IGG),IA: SARS CoV2 AB IGG: NEGATIVE

## 2019-11-23 ENCOUNTER — Encounter: Payer: Self-pay | Admitting: Physician Assistant

## 2019-11-23 DIAGNOSIS — G8929 Other chronic pain: Secondary | ICD-10-CM | POA: Insufficient documentation

## 2019-11-23 DIAGNOSIS — M25561 Pain in right knee: Secondary | ICD-10-CM | POA: Insufficient documentation

## 2019-11-23 NOTE — Progress Notes (Signed)
[name],   No covid antibodies detected.

## 2019-11-28 ENCOUNTER — Other Ambulatory Visit: Payer: Self-pay

## 2019-11-28 ENCOUNTER — Ambulatory Visit (INDEPENDENT_AMBULATORY_CARE_PROVIDER_SITE_OTHER): Payer: 59 | Admitting: Rehabilitative and Restorative Service Providers"

## 2019-11-28 ENCOUNTER — Encounter: Payer: Self-pay | Admitting: Rehabilitative and Restorative Service Providers"

## 2019-11-28 DIAGNOSIS — R29898 Other symptoms and signs involving the musculoskeletal system: Secondary | ICD-10-CM | POA: Diagnosis not present

## 2019-11-28 DIAGNOSIS — M25561 Pain in right knee: Secondary | ICD-10-CM | POA: Diagnosis not present

## 2019-11-28 DIAGNOSIS — M6281 Muscle weakness (generalized): Secondary | ICD-10-CM | POA: Diagnosis not present

## 2019-11-28 NOTE — Patient Instructions (Signed)
Access Code: [email or web address]  URL: [email or web address]  Date: 11/28/2019  Prepared by: Margretta Ditty   Exercises Active Straight Leg Raise with Quad Set - 10 reps - 1 sets - 2x daily - 7x weekly Prone Hip Extension - 10 reps - 1 sets - 2x daily - 7x weekly Sidelying Hip Abduction - 10 reps - 1 sets - 2x daily - 7x weekly Seated Hamstring Stretch with Chair - 3 reps - 1 sets - 30 seconds hold - 2x daily - 7x weekly

## 2019-11-28 NOTE — Therapy (Signed)
[facility] [place] [address] [place], [place], [address] Phone: [phone]   Fax:  [phone]  Physical Therapy Evaluation  Patient Details  Name: Bonnie Allen MRN: [id number] Date of Birth: 16-Nov-1960 Referring Provider (PT): Tandy Gaw, [place]   Encounter Date: 11/28/2019  PT End of Session - 11/28/19 0944    Visit Number  1    Number of Visits  12    Date for PT Re-Evaluation  01/09/20    Authorization Type  aetna $50 copay    PT Start Time  0803    PT Stop Time  0847    PT Time Calculation (min)  44 min    Activity Tolerance  Patient tolerated treatment well;No increased pain    Behavior During Therapy  WFL for tasks assessed/performed       Past Medical History:  Diagnosis Date  . Epilepsy (HCC)   . Lupus (HCC)    skin    Past Surgical History:  Procedure Laterality Date  . CESAREAN SECTION    . CHOLECYSTECTOMY    . FOOT SURGERY    . KNEE SURGERY    . uterine ablation      There were no vitals filed for this visit.   Subjective Assessment - 11/28/19 0811    Subjective  The patient reports ongoing R knee pain that worsened in October after hopping on R leg.  She has had other exacerbation of knee pain in the past with weight lifting and squats.  She is walking for exercise.  She has not returned to weight training.  She has h/o R knee pain 20 years ago, and h/o R hip bursisitis.    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  Yes     Pain Score  1     Pain Location  Knee    Pain Orientation  Right    Pain Descriptors / Indicators  Discomfort;Tender   diastal medial patella   Pain Type  Chronic pain    Pain Onset  More than a month ago    Pain Frequency  Intermittent    Aggravating Factors   squats, rigorous exercise    Pain Relieving Factors  ice, voltaren cream         [facility] PT Assessment - 11/28/19 0806      Assessment   Medical Diagnosis  M25.561,G89.29 (ICD-10-CM) - Chronic pain of right knee    Referring Provider (PT)  Tandy Gaw, PA-C    Onset Date/Surgical Date  --   07/2020   Prior Therapy  none      Precautions   Precautions  None      Restrictions   Weight Bearing Restrictions  No      Balance Screen   Has the patient fallen in the past 6 months  No   "I'm a little clutzy"   Has the patient had a decrease in activity level because of a fear of falling?  No    Is the patient reluctant to leave their home because of a fear of falling?   No      Home Public house manager residence    Living Arrangements  Spouse/significant other    Home Access  Stairs to enter    Entrance Stairs-Number of Steps  2    Home Layout  Able to live on main level with bedroom/bathroom    Home Equipment  None    Additional Comments  painful on stairs      Observation/Other Assessments   Focus on Therapeutic Outcomes (FOTO)   63% (limited by 37%)      Sensation   Light Touch  Appears Intact   except chronic R lateral foot numbness (lumbar injury)     ROM / Strength   AROM / PROM / Strength  AROM;Strength      AROM   AROM Assessment Site  Knee    Right/Left Knee  Right;Left    Right Knee Extension  0    Right Knee Flexion  110   with pain posterior knee   Left Knee Extension  0      Strength   Strength Assessment Site  Hip;Knee;Ankle    Right/Left Hip  Right;Left    Right Hip Flexion  4/5    Left Hip Flexion  5/5    Right/Left Knee  Right;Left    Right Knee Flexion  5/5     Right Knee Extension  5/5    Left Knee Flexion  5/5    Left Knee Extension  5/5    Right/Left Ankle  Right;Left    Right Ankle Dorsiflexion  4/5    Left Ankle Dorsiflexion  5/5      Palpation   Palpation comment  R posterior lateral knee with mild edema as compared to the left, point tender to R distal medial tissue near patellar border, tender R medial hamstring insertion      Special Tests   Other special tests  tenderness R hamstring with stretching, note greater tightness then left side; hypomobility R patella; L patella moves medially no lateral mobility      Ambulation/Gait   Ambulation/Gait  Yes    Ambulation/Gait Assistance  7: Independent    Stairs  Yes    Stairs Assistance  7: Independent    Stair Management Technique  No rails;Alternating pattern   pain over anterior, medial knee during descending steps   Number of Stairs  8                Objective measurements completed on examination: See above findings.      [facility] Adult PT Treatment/Exercise - 11/28/19 0806      Exercises   Exercises  Knee/Hip      Knee/Hip Exercises: Supine   Quad Sets  5 reps    Straight Leg Raises  Strengthening;Right;10 reps      Knee/Hip Exercises: Sidelying   Hip ABduction  Strengthening;Right;10 reps      Knee/Hip Exercises: Prone   Hip Extension  Strengthening;Right;10 reps             PT Education - 11/28/19 0849    Education Details  HEP    Person(s) Educated  Patient    Methods  Explanation;Demonstration;Handout    Comprehension  Verbalized understanding;Returned demonstration          PT Long Term Goals - 11/28/19 0945      PT LONG TERM GOAL #  1   Title  The patient will be indep with HEP    Time  6    Period  Weeks    Target Date  01/09/20      PT LONG TERM GOAL #2   Title  The patient will reduce functional limitation from 37% to < or equal to 25% per [employer].    Time  6    Period  Weeks    Target Date  01/09/20      PT LONG TERM  GOAL #3   Title  The patient will report no pain R knee when descending 6" step x 12 reps (to simulate flight of stairs) in a reciprocal pattern.    Time  6    Period  Weeks    Target Date  01/09/20      PT LONG TERM GOAL #4   Title  The patient will perform modified squat to return to home strengthening program for long term health and wellness.    Time  6    Period  Weeks    Target Date  01/09/20      PT LONG TERM GOAL #5   Title  The patient will return to walking > 2 miles without increase in knee pain from baseline per report.    Time  6    Period  Weeks    Target Date  01/09/20             Plan - 11/28/19 0849    Clinical Impression Statement  The patient is a 59 year old female presenting to OP physical therapy with h/o chronic R knee pain exacerbated in October 2020.  She has impairments of pain with end range flexion in posterolateral aspect of R knee, decreased ankle DF strength R (could be due to h/o lumbar injury in distant past), hypomobility R patella, hamstring tightness, and pain.  Impairments lead to functional limitations of pain with stairs, pain after sitting for long periods, pain limiting participation in wellness program and cardio activities.  PT to addrewss deficits to optimize funcitonal mobility.    Personal Factors and Comorbidities  Comorbidity 1;Comorbidity 2    Comorbidities  migraines, seizure disorder    Examination-Activity Limitations  Stairs;Squat;Locomotion Level    Examination-Participation Restrictions  Community Activity;Yard Work    Stability/Clinical Decision Making  Stable/Uncomplicated    Optometrist  Low    Rehab Potential  Good    PT Frequency  2x / week    PT Duration  6 weeks    PT Treatment/Interventions  ADLs/Self Care Home Management;Cryotherapy;Moist Heat;Gait training;Stair training;Functional mobility training;Therapeutic activities;Therapeutic exercise;Neuromuscular re-education;Manual  techniques;Taping;Patient/family education    PT Next Visit Plan  *NO E-STIM (epilepsy), STM and IASTM, contract/relax stretching HS, patellarl mobilization, taping, check IT band tightness, LE strengthening    PT Home Exercise Plan  Access Code: [email or web address]    Consulted and Agree with Plan of Care  Patient       Patient will benefit from skilled therapeutic intervention in order to improve the following deficits and impairments:  Abnormal gait, Pain, Decreased strength, Hypomobility, Impaired flexibility  Visit Diagnosis: Acute pain of right knee  Muscle weakness (generalized)  Other symptoms and signs involving the musculoskeletal system     Problem List Patient Active Problem List   Diagnosis Date Noted  . Chronic pain of right knee 11/23/2019  . Contusion of right thigh 03/07/2019  . Right leg swelling 03/07/2019  . Discoid lupus erythematosus 03/07/2019  .  Trochanteric bursitis of right hip 03/07/2019  . Left leg pain 03/05/2019  . Nummular eczema 11/18/2017  . Atrophic vaginitis 09/22/2017  . Thumb pain, right 06/30/2017  . Elevated blood pressure reading 06/13/2017  . Caregiver stress syndrome 06/13/2017  . Elevated vitamin B12 level 02/24/2017  . Allergy to multiple antibiotics 02/24/2017  . Osteopenia 02/21/2017  . Migraine without aura and without status migrainosus, not intractable 02/21/2017  . Herpes zoster without complication 16/07/9603  . Hyperlipidemia LDL goal <130 11/26/2016  . OSA on CPAP 11/24/2016  . Seizure disorder ([place]) 11/24/2016    [place], [place] 11/28/2019, 9:53 AM  [facility] [place], [place], [address] Phone: [phone]   Fax:  [phone]  Name: Karrine Kluttz MRN: [id number] Date of Birth: 05-Jun-1961

## 2019-12-02 LAB — COLOGUARD: Cologuard: NEGATIVE

## 2019-12-03 ENCOUNTER — Ambulatory Visit (INDEPENDENT_AMBULATORY_CARE_PROVIDER_SITE_OTHER): Payer: 59 | Admitting: Physical Therapy

## 2019-12-03 ENCOUNTER — Other Ambulatory Visit: Payer: Self-pay

## 2019-12-03 DIAGNOSIS — M25561 Pain in right knee: Secondary | ICD-10-CM

## 2019-12-03 DIAGNOSIS — M6281 Muscle weakness (generalized): Secondary | ICD-10-CM | POA: Diagnosis not present

## 2019-12-03 DIAGNOSIS — R29898 Other symptoms and signs involving the musculoskeletal system: Secondary | ICD-10-CM

## 2019-12-03 NOTE — Therapy (Signed)
[facility] [place] [address] [place], [place], [address] Phone: [phone]   Fax:  [phone]  Physical Therapy Treatment  Patient Details  Name: Bonnie Allen MRN: [id number] Date of Birth: 1961-09-13 Referring Provider (PT): Tandy Gaw, [place]   Encounter Date: 12/03/2019  PT End of Session - 12/03/19 1445    Visit Number  2    Number of Visits  12    Date for PT Re-Evaluation  01/09/20    Authorization Type  aetna $50 copay    PT Start Time  1438   pt arrived late   PT Stop Time  1518    PT Time Calculation (min)  40 min    Activity Tolerance  Patient tolerated treatment well;No increased pain    Behavior During Therapy  WFL for tasks assessed/performed       Past Medical History:  Diagnosis Date  . Epilepsy (HCC)   . Lupus (HCC)    skin    Past Surgical History:  Procedure Laterality Date  . CESAREAN SECTION    . CHOLECYSTECTOMY    . FOOT SURGERY    . KNEE SURGERY    . uterine ablation      There were no vitals filed for this visit.  Subjective Assessment - 12/03/19 1447    Subjective  Pt reports she can go up stairs a little easier.  She has done her exercises a few times since last visit.  She complains of stiffness in Rt knee with prolonged sitting or standing (ie: 3 church services for choir)    Currently in Pain?  No/denies    Pain Score  0-No pain         [facility] PT Assessment - 12/03/19 0001      Assessment   Medical Diagnosis  M25.561,G89.29 (ICD-10-CM) - Chronic pain of right knee    Referring Provider (PT)  Tandy Gaw, PA-C      AROM   Right Knee Flexion  123      Strength   Right/Left Hip  Right      Flexibility   Soft Tissue Assessment /Muscle Length  yes    Quadriceps  RLE: 110, LLE: 131       [facility] Adult PT Treatment/Exercise - 12/03/19 0001      Self-Care   Self-Care  Other Self-Care Comments    Other Self-Care Comments   pt educated on self massage to Rt thigh with  roller stick; pt verbalized understanding      Knee/Hip Exercises: Stretches   Lobbyist  Right;3 reps;30 seconds;Left;1 rep;20 seconds    Hip Flexor Stretch  2 reps;30 seconds;Right   seated, supine      Knee/Hip Exercises: Aerobic   Tread Mill  3.0 mph x 2.5 min     Nustep  L3: 3 min       Knee/Hip Exercises: Supine   Bridges  Strengthening;Both;1 set    Straight Leg Raises  Strengthening;Right;10 reps      Knee/Hip Exercises: Prone   Hip Extension  Strengthening;Right;Left;1 set;10 reps      Manual Therapy   Manual Therapy  Soft tissue mobilization;Taping    Soft tissue mobilization  IASTM to Rt quad to decrease fascial tightness and improve ROM     Kinesiotex  Lawyer Space  2 - I strips of sensitive skin Rock tape applied in X pattern to medial Rt knee joint line  to decompress  tissue and improved proprioception                   PT Long Term Goals - 11/28/19 0945      PT LONG TERM GOAL #1   Title  The patient will be indep with HEP    Time  6    Period  Weeks    Target Date  01/09/20      PT LONG TERM GOAL #2   Title  The patient will reduce functional limitation from 37% to < or equal to 25% per [employer].    Time  6    Period  Weeks    Target Date  01/09/20      PT LONG TERM GOAL #3   Title  The patient will report no pain R knee when descending 6" step x 12 reps (to simulate flight of stairs) in a reciprocal pattern.    Time  6    Period  Weeks    Target Date  01/09/20      PT LONG TERM GOAL #4   Title  The patient will perform modified squat to return to home strengthening program for long term health and wellness.    Time  6    Period  Weeks    Target Date  01/09/20      PT LONG TERM GOAL #5   Title  The patient will return to walking > 2 miles without increase in knee pain from baseline per report.    Time  6    Period  Weeks    Target Date  01/09/20            Plan - 12/03/19 1529     Clinical Impression Statement  Pt demonstrated improved Rt knee AROM flexion, however, has very tight quad. Pt tolerated prone quad stretch better with a rolled up towel above knee.  Goals are ongoing.    Personal Factors and Comorbidities  Comorbidity 1;Comorbidity 2    Comorbidities  migraines, seizure disorder    Examination-Activity Limitations  Stairs;Squat;Locomotion Level    Examination-Participation Restrictions  Community Activity;Yard Work    Stability/Clinical Decision Making  Stable/Uncomplicated    Rehab Potential  Good    PT Frequency  2x / week    PT Duration  6 weeks    PT Treatment/Interventions  ADLs/Self Care Home Management;Cryotherapy;Moist Heat;Gait training;Stair training;Functional mobility training;Therapeutic activities;Therapeutic exercise;Neuromuscular re-education;Manual techniques;Taping;Patient/family education    PT Next Visit Plan  continue IASTM/ STM to Lt quad.  RLE flexibility and strengthening. assess response to ktape application    PT Home Exercise Plan  Access Code: [email or web address]    Recommended Other Services  NO ESTIM (epilepsy)    Consulted and Agree with Plan of Care  Patient       Patient will benefit from skilled therapeutic intervention in order to improve the following deficits and impairments:  Abnormal gait, Pain, Decreased strength, Hypomobility, Impaired flexibility  Visit Diagnosis: Acute pain of right knee  Muscle weakness (generalized)  Other symptoms and signs involving the musculoskeletal system     Problem List Patient Active Problem List   Diagnosis Date Noted  . Chronic pain of right knee 11/23/2019  . Contusion of right thigh 03/07/2019  . Right leg swelling 03/07/2019  . Discoid lupus erythematosus 03/07/2019  . Trochanteric bursitis of right hip 03/07/2019  . Left leg pain 03/05/2019  . Nummular eczema 11/18/2017  . Atrophic vaginitis 09/22/2017  . Thumb pain, right 06/30/2017  .  Elevated blood pressure reading  06/13/2017  . Caregiver stress syndrome 06/13/2017  . Elevated vitamin B12 level 02/24/2017  . Allergy to multiple antibiotics 02/24/2017  . Osteopenia 02/21/2017  . Migraine without aura and without status migrainosus, not intractable 02/21/2017  . Herpes zoster without complication 92/42/6834  . Hyperlipidemia LDL goal <130 11/26/2016  . OSA on CPAP 11/24/2016  . Seizure disorder [facility]) 11/24/2016   Kerin Perna, PTA 12/03/19 5:11 PM  [place], [place], [address] Phone: [phone]   Fax:  [phone]  Name: Oasis Goehring MRN: [id number] Date of Birth: 04-07-1961

## 2019-12-04 NOTE — Telephone Encounter (Signed)
Cologuard received with negative result. Abstracted. Left message on machine for patient to call back for results.

## 2019-12-04 NOTE — Telephone Encounter (Signed)
Patient called back. Made aware of results and recommendations.

## 2019-12-05 ENCOUNTER — Encounter: Payer: Self-pay | Admitting: Rehabilitative and Restorative Service Providers"

## 2019-12-05 ENCOUNTER — Ambulatory Visit (INDEPENDENT_AMBULATORY_CARE_PROVIDER_SITE_OTHER): Payer: 59 | Admitting: Rehabilitative and Restorative Service Providers"

## 2019-12-05 ENCOUNTER — Other Ambulatory Visit: Payer: Self-pay

## 2019-12-05 DIAGNOSIS — M25561 Pain in right knee: Secondary | ICD-10-CM | POA: Diagnosis not present

## 2019-12-05 DIAGNOSIS — R29898 Other symptoms and signs involving the musculoskeletal system: Secondary | ICD-10-CM

## 2019-12-05 DIAGNOSIS — M6281 Muscle weakness (generalized): Secondary | ICD-10-CM

## 2019-12-05 NOTE — Therapy (Signed)
[place], [place], [address] Phone: [phone]   Fax:  [phone]  Physical Therapy Treatment  Patient Details  Name: Bonnie Allen MRN: [id number] Date of Birth: Aug 04, 1961 Referring Provider (PT): Iran Planas, [place]   Encounter Date: 12/05/2019  PT End of Session - 12/05/19 1112    Visit Number  3    Number of Visits  12    Date for PT Re-Evaluation  01/09/20    Authorization Type  aetna $50 copay    PT Start Time  1104    PT Stop Time  1154    PT Time Calculation (min)  50 min    Activity Tolerance  Patient tolerated treatment well;No increased pain    Behavior During Therapy  WFL for tasks assessed/performed       Past Medical History:  Diagnosis Date  . Epilepsy ([place])   . Lupus ([place])    skin    Past Surgical History:  Procedure Laterality Date  . CESAREAN SECTION    . CHOLECYSTECTOMY    . FOOT SURGERY    . KNEE SURGERY    . uterine ablation      There were no vitals filed for this visit.  Subjective Assessment - 12/05/19 1105    Subjective  Pt reports she was able to walk 2 miles this morning (>3.0 mph pace).  She reports it was 40 minutes of exercise.  She is using the pastry roller to roll out sore areas.    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  No/denies                       [facility] Adult PT Treatment/Exercise - 12/05/19 1102      Self-Care   Self-Care  Other Self-Care  Comments    Other Self-Care Comments   unsure of benefits of tape      Exercises   Exercises  Knee/Hip      Knee/Hip Exercises: Stretches   Active Hamstring Stretch  Right;Left;1 rep;30 seconds    Quad Stretch  Right;Left;2 reps;30 seconds    Hip Flexor Stretch  Right;Left;30 seconds    Piriformis Stretch  Right;Left;1 rep;30 seconds    Gastroc Stretch  Right;Left;2 reps;30 seconds      Knee/Hip Exercises: Aerobic   Stationary Bike  Attempted stationary bike- patient has to slide to edge of seat to reach pedals on lowest setting, therefore warmed up with walking      Knee/Hip Exercises: Standing   Heel Raises  Right;Left;Both;10 reps    Heel Raises Limitations  10 reps bilat, then 10 reps R and L unilateral    Side Lunges  Right;Left    Side Lunges Limitations  On compliant surfaces side lunges/steps and then return to foam x 10 reps each direction.    Lateral Step Up  10 reps;Right    Step Down  Right;Left;10 reps    Step  Down Limitations  compliant surfaces stepping posteriorly off foam alternating R and L sides    Functional Squat  5 reps    Functional Squat Limitations  patient has pain in R anterior knee with mini squat      Knee/Hip Exercises: Supine   Bridges  Strengthening;10 reps    Straight Leg Raise with External Rotation  Strengthening;Right;Left;10 reps    Patellar Mobs  superior grade II mobs R knee      Knee/Hip Exercises: Prone   Hip Extension  Strengthening;Right;Left;10 reps      Manual Therapy   Manual Therapy  Soft tissue mobilization    Soft tissue mobilization  used roller in conjunction with sidelying ITB stretch in order to reduce muscle tightness                  PT Long Term Goals - 11/28/19 0945      PT LONG TERM GOAL #1   Title  The patient will be indep with HEP    Time  6    Period  Weeks    Target Date  01/09/20      PT LONG TERM GOAL #2   Title  The patient will reduce functional limitation from 37% to < or equal to 25% per  [employer].    Time  6    Period  Weeks    Target Date  01/09/20      PT LONG TERM GOAL #3   Title  The patient will report no pain R knee when descending 6" step x 12 reps (to simulate flight of stairs) in a reciprocal pattern.    Time  6    Period  Weeks    Target Date  01/09/20      PT LONG TERM GOAL #4   Title  The patient will perform modified squat to return to home strengthening program for long term health and wellness.    Time  6    Period  Weeks    Target Date  01/09/20      PT LONG TERM GOAL #5   Title  The patient will return to walking > 2 miles without increase in knee pain from baseline per report.    Time  6    Period  Weeks    Target Date  01/09/20            Plan - 12/05/19 1213    Clinical Impression Statement  The patient was able to walk this morning without increased knee pain.  She is tolerating strengthening well, however squats continue to increase knee pain.  Plan to continue progressing to LTGs.    Examination-Participation Restrictions  Community Activity;Yard Work    Stability/Clinical Decision Making  Stable/Uncomplicated    Rehab Potential  Good    PT Frequency  2x / week    PT Duration  6 weeks    PT Treatment/Interventions  ADLs/Self Care Home Management;Cryotherapy;Moist Heat;Gait training;Stair training;Functional mobility training;Therapeutic activities;Therapeutic exercise;Neuromuscular re-education;Manual techniques;Taping;Patient/family education    PT Next Visit Plan  continue IASTM/ STM to R quad.  RLE flexibility and strengthening. assess response to ktape application; distal R LE strengthening    PT Home Exercise Plan  Access Code: [email or web address]    Consulted and Agree with Plan of Care  Patient       Patient will benefit from skilled therapeutic intervention in order to improve the following deficits and impairments:  Abnormal gait, Pain, Decreased strength, Hypomobility, Impaired flexibility  Visit Diagnosis: Acute pain of right  knee  Muscle weakness (generalized)  Other symptoms and signs involving the musculoskeletal system     Problem List Patient Active Problem List   Diagnosis Date Noted  . Chronic pain of right knee 11/23/2019  . Contusion of right thigh 03/07/2019  . Right leg swelling 03/07/2019  . Discoid lupus erythematosus 03/07/2019  . Trochanteric bursitis of right hip 03/07/2019  . Left leg pain 03/05/2019  . Nummular eczema 11/18/2017  . Atrophic vaginitis 09/22/2017  . Thumb pain, right 06/30/2017  . Elevated blood pressure reading 06/13/2017  . Caregiver stress syndrome 06/13/2017  . Elevated vitamin B12 level 02/24/2017  . Allergy to multiple antibiotics 02/24/2017  . Osteopenia 02/21/2017  . Migraine without aura and without status migrainosus, not intractable 02/21/2017  . Herpes zoster without complication 02/12/2017  . Hyperlipidemia LDL goal <130 11/26/2016  . OSA on CPAP 11/24/2016  . Seizure disorder ([facility]) 11/24/2016    Johnthan Axtman, PT 12/05/2019, 12:16 PM  [facility] [address] [place], [place], [address] Phone: [phone]   Fax:  [phone]  Name: Bonnie Allen MRN: [id number] Date of Birth: 1960/11/11

## 2019-12-05 NOTE — Patient Instructions (Signed)
Access Code: [email or web address]  URL: [email or web address]  Date: 12/05/2019  Prepared by: Margretta Ditty   Exercises Straight Leg Raise with External Rotation - 10 reps - 1 sets - 2x daily - 7x weekly Prone Hip Extension - 10 reps - 1 sets - 2x daily - 7x weekly Sidelying Hip Abduction - 10 reps - 1 sets - 2x daily - 7x weekly Single Leg Heel Raise with Counter Support - 10 reps - 1 sets - 2x daily - 7x weekly Seated Hamstring Stretch with Chair - 3 reps - 1 sets - 30 seconds hold - 2x daily - 7x weekly Prone Quad Stretch with Towel Roll and Strap - 2-3 reps - 1 sets - 30 hold - 2x daily - 7x weekly Hip Flexor Stretch at Edge of Bed - 2 reps - 1 sets - 15-30 hold - 1x daily - 7x weekly Seated Hip Flexor Stretch - 2 reps - 1 sets - 15-30 hold - 1x daily - 7x weekly

## 2019-12-10 ENCOUNTER — Ambulatory Visit (INDEPENDENT_AMBULATORY_CARE_PROVIDER_SITE_OTHER): Payer: 59 | Admitting: Physical Therapy

## 2019-12-10 ENCOUNTER — Other Ambulatory Visit: Payer: Self-pay

## 2019-12-10 DIAGNOSIS — R29898 Other symptoms and signs involving the musculoskeletal system: Secondary | ICD-10-CM

## 2019-12-10 DIAGNOSIS — M25561 Pain in right knee: Secondary | ICD-10-CM | POA: Diagnosis not present

## 2019-12-10 DIAGNOSIS — M6281 Muscle weakness (generalized): Secondary | ICD-10-CM

## 2019-12-10 NOTE — Therapy (Signed)
[place], [place], [address] Phone: [phone]   Fax:  [phone]  Physical Therapy Treatment  Patient Details  Name: Bonnie Allen MRN: [id number] Date of Birth: Mar 25, 1961 Referring Provider (PT): Iran Planas, [place]   Encounter Date: 12/10/2019  PT End of Session - 12/10/19 1102    Visit Number  4    Number of Visits  12    Date for PT Re-Evaluation  01/09/20    Authorization Type  aetna $50 copay    PT Start Time  1102    PT Stop Time  1143    PT Time Calculation (min)  41 min    Activity Tolerance  Patient tolerated treatment well    Behavior During Therapy  [facility] for tasks assessed/performed       Past Medical History:  Diagnosis Date  . Epilepsy ([place])   . Lupus ([address])    skin    Past Surgical History:  Procedure Laterality Date  . CESAREAN SECTION    . CHOLECYSTECTOMY    . FOOT SURGERY    . KNEE SURGERY    . uterine ablation      There were no vitals filed for this visit.  Subjective Assessment - 12/10/19 1109    Subjective  Pt reports her 50# dog slept between her legs on Saturday, and that has irritated her knee.  Rolling legs has helped.  Her RLE continues to fatigue quickly with single heel raises.  She continues to have sharp pain in medial right knee with descending stairs.    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  Yes    Pain Score  3     Pain Location  Knee    Pain Orientation  Right    Pain Descriptors / Indicators  Sore    Aggravating Factors   squats, stairs    Pain Relieving Factors  ice, stretching.         [facility] PT Assessment - 12/10/19 0001      Assessment   Medical Diagnosis  M25.561,G89.29 (ICD-10-CM) - Chronic pain of right knee    Referring Provider (PT)  Iran Planas, PA-C      Flexibility   Quadriceps  RLE: 117 deg, LLE: 135 deg        [facility] Adult PT Treatment/Exercise - 12/10/19 0001      Self-Care   Self-Care  Other Self-Care Comments    Other Self-Care Comments   Pt instructed on application of Rock tape to Rt knee; pt was able to verbalize how to apply and understanding.       Knee/Hip Exercises: Stretches   Passive Hamstring Stretch  Right;1 rep;20 seconds    Personal assistant reps;Left;1 rep;30 seconds   prone with strap   Hip Flexor Stretch  Right;4 reps;30 seconds   seated   Gastroc Stretch  Both;2 reps;20 seconds   incline board      Knee/Hip Exercises:  Aerobic   Tread Mill  2.7-2.8 mph x 5 min       Knee/Hip Exercises: Standing   Heel Raises  Right;Left;1 set;10 reps   UE support on counter   Step Down  Left;1 set;10 reps;Step Height: 2";Hand Hold: 2   retro step with RLE   Functional Squat  1 set;5 reps    Functional Squat Limitations  patient has pain in R anterior knee with mini squat    Stairs  reciprocal pattern 3-6" x 10 (some pain in Rt knee)     SLS  Rt/Lt x 1 rep, up to 8 sec each    Other Standing Knee Exercises  Rt hamstring curls x 10 (no weight)      Knee/Hip Exercises: Supine   Bridges  Strengthening;1 set;10 reps   5 sec hold     Kinesiotix   Create Space  2 - I strips of Reg skin Rock tape applied to medial R t joint line to decompress tissue and improved proprioception                   PT Long Term Goals - 12/10/19 1139      PT LONG TERM GOAL #1   Title  The patient will be indep with HEP    Time  6    Period  Weeks    Status  On-going      PT LONG TERM GOAL #2   Title  The  patient will reduce functional limitation from 37% to < or equal to 25% per [employer].    Time  6    Period  Weeks    Status  On-going      PT LONG TERM GOAL #3   Title  The patient will report no pain R knee when descending 6" step x 12 reps (to simulate flight of stairs) in a reciprocal pattern.    Baseline  pain is 5/10 with descending stairs.    Time  6    Period  Weeks    Status  On-going      PT LONG TERM GOAL #4   Title  The patient will perform modified squat to return to home strengthening program for long term health and wellness.    Time  6    Period  Weeks    Status  On-going      PT LONG TERM GOAL #5   Title  The patient will return to walking > 2 miles without increase in knee pain from baseline per report.    Baseline  up to walking 1.5 mile, pain at 0/10    Time  6    Period  Weeks    Status  On-going            Plan - 12/10/19 1217    Clinical Impression Statement  Pt's quad flexibility has improved (bilat).  Pt reported elimination of Rt knee pain after warm up and quad/hip flexor stretches. She continues to have sharp pain in Rt knee with knee flexion (standing Rt hamstring curl, mini-squat, and descending steps). Pt progressing towards LTG#5.    Examination-Participation Restrictions  Community Activity;Yard Work    Stability/Clinical Decision Making  Stable/Uncomplicated    Rehab Potential  Good    PT Frequency  2x / week    PT Duration  6 weeks    PT Treatment/Interventions  ADLs/Self Care Home Management;Cryotherapy;Moist Heat;Gait training;Stair training;Functional mobility training;Therapeutic activities;Therapeutic exercise;Neuromuscular re-education;Manual techniques;Taping;Patient/family education    PT  Next Visit Plan  continue IASTM/ STM to R quad.  continue RLE flexibility and strengthening.    PT Home Exercise Plan  Access Code: [email or web address]    Recommended Other Services  NO Estim (epilepsy)    Consulted and Agree with Plan of Care  Patient        Patient will benefit from skilled therapeutic intervention in order to improve the following deficits and impairments:  Abnormal gait, Pain, Decreased strength, Hypomobility, Impaired flexibility  Visit Diagnosis: Acute pain of right knee  Muscle weakness (generalized)  Other symptoms and signs involving the musculoskeletal system     Problem List Patient Active Problem List   Diagnosis Date Noted  . Chronic pain of right knee 11/23/2019  . Contusion of right thigh 03/07/2019  . Right leg swelling 03/07/2019  . Discoid lupus erythematosus 03/07/2019  . Trochanteric bursitis of right hip 03/07/2019  . Left leg pain 03/05/2019  . Nummular eczema 11/18/2017  . Atrophic vaginitis 09/22/2017  . Thumb pain, right 06/30/2017  . Elevated blood pressure reading 06/13/2017  . Caregiver stress syndrome 06/13/2017  . Elevated vitamin B12 level 02/24/2017  . Allergy to multiple antibiotics 02/24/2017  . Osteopenia 02/21/2017  . Migraine without aura and without status migrainosus, not intractable 02/21/2017  . Herpes zoster without complication 02/12/2017  . Hyperlipidemia LDL goal <130 11/26/2016  . OSA on CPAP 11/24/2016  . Seizure disorder [facility]) 11/24/2016   Mayer Camel, PTA 12/10/19 12:24 PM  [facility] [place] [address] [place], [place], [address] Phone: [phone]   Fax:  [phone]  Name: Herberta Pickron MRN: [id number] Date of Birth: August 28, 1961

## 2019-12-12 ENCOUNTER — Ambulatory Visit (INDEPENDENT_AMBULATORY_CARE_PROVIDER_SITE_OTHER): Payer: 59 | Admitting: Physical Therapy

## 2019-12-12 ENCOUNTER — Other Ambulatory Visit: Payer: Self-pay

## 2019-12-12 DIAGNOSIS — R29898 Other symptoms and signs involving the musculoskeletal system: Secondary | ICD-10-CM | POA: Diagnosis not present

## 2019-12-12 DIAGNOSIS — M6281 Muscle weakness (generalized): Secondary | ICD-10-CM | POA: Diagnosis not present

## 2019-12-12 DIAGNOSIS — M25561 Pain in right knee: Secondary | ICD-10-CM

## 2019-12-12 NOTE — Therapy (Signed)
[place], [place], [address] Phone: [phone]   Fax:  [phone]  Physical Therapy Treatment  Patient Details  Name: Bonnie Allen MRN: [id number] Date of Birth: 13-Sep-1961 Referring Provider (PT): Iran Planas, [place]   Encounter Date: 12/12/2019  PT End of Session - 12/12/19 1109    Visit Number  5    Number of Visits  12    Date for PT Re-Evaluation  01/09/20    Authorization Type  aetna $50 copay    PT Start Time  1104    PT Stop Time  1147    PT Time Calculation (min)  43 min    Activity Tolerance  Patient tolerated treatment well;No increased pain    Behavior During Therapy  WFL for tasks assessed/performed       Past Medical History:  Diagnosis Date  . Epilepsy ([place])   . Lupus ([place])    skin    Past Surgical History:  Procedure Laterality Date  . CESAREAN SECTION    . CHOLECYSTECTOMY    . FOOT SURGERY    . KNEE SURGERY    . uterine ablation      There were no vitals filed for this visit.  Subjective Assessment - 12/12/19 1110    Subjective  Pt reports her knee is feeling better today.  She was able to walk on treadmill with varying incline (3% grade) up to 30 min, with minimal pain.  She stretched and iced knee. This morning she was able to go up stairs with less difficulty and pain.    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  No/denies    Pain Score  0-No pain         [facility] PT Assessment -  12/12/19 0001      Assessment   Medical Diagnosis  M25.561,G89.29 (ICD-10-CM) - Chronic pain of right knee    Referring Provider (PT)  Iran Planas, PA-C       [facility] Adult PT Treatment/Exercise - 12/12/19 0001      Knee/Hip Exercises: Stretches   Passive Hamstring Stretch  Right;Left;2 reps;20 seconds    Personal assistant reps;Left;1 rep;30 seconds   prone with strap   Hip Flexor Stretch  Right;2 reps;Left;30 seconds    Gastroc Stretch  Both;2 reps;30 seconds   incline board      Knee/Hip Exercises: Aerobic   Tread Mill  3.0 mph x 5 min       Knee/Hip Exercises: Standing   Wall Squat  1 set;5 reps;5 seconds   with ball squeeze   SLS  single leg forward leans to chair height x 5 reps each side.     Other Standing Knee Exercises  warrior 2 x 30 sec, then 30 sec of pulses.       Knee/Hip Exercises: Supine   Single Leg Bridge  Strengthening;Right;Left;5 reps;2 sets   fig 4 and straight leg   Straight Leg  Raise with External Rotation  Strengthening;Right;1 set;5 reps    Straight Leg Raise with External Rotation Limitations  Long sitting with SLR to abdct/add x 10    Other Supine Knee/Hip Exercises  bridge with marching x 10 steps, 3 sets.       [employer]   Create Space  2 - I strips of Reg skin Rock tape applied to medial R t joint line to decompress tissue and improved proprioception              PT Education - 12/12/19 1151    Education Details  HEP    Person(s) Educated  Patient    Methods  Explanation;Demonstration    Comprehension  Verbalized understanding;Returned demonstration          PT Long Term Goals - 12/10/19 1139      PT LONG TERM GOAL #1   Title  The patient will be indep with HEP    Time  6    Period  Weeks    Status  On-going      PT LONG TERM GOAL #2   Title  The patient will reduce functional limitation from 37% to < or equal to 25% per [employer].    Time  6    Period  Weeks    Status  On-going      PT LONG TERM GOAL #3    Title  The patient will report no pain R knee when descending 6" step x 12 reps (to simulate flight of stairs) in a reciprocal pattern.    Baseline  pain is 5/10 with descending stairs.    Time  6    Period  Weeks    Status  On-going      PT LONG TERM GOAL #4   Title  The patient will perform modified squat to return to home strengthening program for long term health and wellness.    Time  6    Period  Weeks    Status  On-going      PT LONG TERM GOAL #5   Title  The patient will return to walking > 2 miles without increase in knee pain from baseline per report.    Baseline  up to walking 1.5 mile, pain at 0/10    Time  6    Period  Weeks    Status  On-going            Plan - 12/12/19 1714    Clinical Impression Statement  Pt has had positive response to tape applied to knee and to stretches. Pt making gains towards all goals. She tolerated exercises well today, without pain.    Examination-Participation Restrictions  Community Activity;Yard Work    Stability/Clinical Decision Making  Stable/Uncomplicated    Rehab Potential  Good    PT Frequency  2x / week    PT Duration  6 weeks    PT Treatment/Interventions  ADLs/Self Care Home Management;Cryotherapy;Moist Heat;Gait training;Stair training;Functional mobility training;Therapeutic activities;Therapeutic exercise;Neuromuscular re-education;Manual techniques;Taping;Patient/family education    PT Next Visit Plan  continue IASTM/ STM to R quad.  continue RLE flexibility and strengthening.    PT Home Exercise Plan  Access Code: [email or web address]    Consulted and Agree with Plan of Care  Patient       Patient will benefit from skilled therapeutic intervention in order to improve the following deficits and impairments:  Abnormal gait, Pain, Decreased strength, Hypomobility, Impaired flexibility  Visit Diagnosis: Acute pain of right knee  Muscle weakness (generalized)  Other symptoms and signs involving the musculoskeletal  system     Problem List Patient Active Problem List   Diagnosis Date Noted  . Chronic pain of right knee 11/23/2019  . Contusion of right thigh 03/07/2019  . Right leg swelling 03/07/2019  . Discoid lupus erythematosus 03/07/2019  . Trochanteric bursitis of right hip 03/07/2019  . Left leg pain 03/05/2019  . Nummular eczema 11/18/2017  . Atrophic vaginitis 09/22/2017  . Thumb pain, right 06/30/2017  . Elevated blood pressure reading 06/13/2017  . Caregiver stress syndrome 06/13/2017  . Elevated vitamin B12 level 02/24/2017  . Allergy to multiple antibiotics 02/24/2017  . Osteopenia 02/21/2017  . Migraine without aura and without status migrainosus, not intractable 02/21/2017  . Herpes zoster without complication 02/12/2017  . Hyperlipidemia LDL goal <130 11/26/2016  . OSA on CPAP 11/24/2016  . Seizure disorder [facility]) 11/24/2016   Mayer Camel, PTA 12/12/19 5:15 PM  [facility] [place] [address] [place], [place], [address] Phone: [phone]   Fax:  [phone]  Name: Na Waldrip MRN: [id number] Date of Birth: [birth year]

## 2019-12-12 NOTE — Patient Instructions (Signed)
Access Code: [email or web address]  URL: [email or web address]  Date: 12/12/2019  Prepared by: Mayer Camel   Exercises  Seated Hamstring Stretch with Chair - 3 reps - 1 sets - 30 seconds hold - 2x daily - 7x weekly  Prone Quad Stretch with Towel Roll and Strap - 2-3 reps - 1 sets - 30 hold - 2x daily - 7x weekly  Hip Flexor Stretch at Edge of Bed - 2 reps - 1 sets - 15-30 hold - 1x daily - 7x weekly  Seated Hip Flexor Stretch - 2 reps - 1 sets - 15-30 hold - 1x daily - 7x weekly  Wall Squat Hold with Ball - 10 reps - 1 sets - 5 hold - 1x daily - 7x weekly  Single Leg Heel Raise with Counter Support - 10 reps - 1 sets - 2x daily - 7x weekly  Warrior II - 2 reps - 1 sets - 30 hold - 1x daily - 7x weekly  Forward T - 5 reps - 1 sets - 1x daily - 7x weekly  Single Leg Bridge - 10 reps - 1 sets - 1x daily - 7x weekly  Long Sitting Straight Leg Raise with External Rotation - 10 reps - 1 sets - 1x daily - 7x weekly

## 2019-12-14 ENCOUNTER — Encounter: Payer: Self-pay | Admitting: Physician Assistant

## 2019-12-14 ENCOUNTER — Ambulatory Visit (INDEPENDENT_AMBULATORY_CARE_PROVIDER_SITE_OTHER): Payer: 59 | Admitting: Physician Assistant

## 2019-12-14 ENCOUNTER — Other Ambulatory Visit: Payer: Self-pay

## 2019-12-14 VITALS — BP 102/74 | HR 73 | Temp 97.9°F | Ht 58.5 in | Wt 150.0 lb

## 2019-12-14 DIAGNOSIS — H9202 Otalgia, left ear: Secondary | ICD-10-CM

## 2019-12-14 DIAGNOSIS — R599 Enlarged lymph nodes, unspecified: Secondary | ICD-10-CM

## 2019-12-14 DIAGNOSIS — Z79899 Other long term (current) drug therapy: Secondary | ICD-10-CM

## 2019-12-14 NOTE — Progress Notes (Signed)
Subjective:    Patient ID: Bonnie Allen, female    DOB: May 07, 1961, 59 y.o.   MRN: [id number]  HPI  Pt is a 59 yo female pmH of seizures, migraines, Discoid lupus who presents to the clinic to follow up after start of cell cept on feb 9th. 3 days ago she started having a headache with left ear pain and left swollen sore neck. She denies any fever, chills, cough, SOB, loss of smell or taste, GI symptoms. She called rheumatology to see if was medication reaction and they told her to come here. Not tried anything to make better.  .. Active Ambulatory Problems    Diagnosis Date Noted  . OSA on CPAP 11/24/2016  . Seizure disorder ([place]) 11/24/2016  . Hyperlipidemia LDL goal <130 11/26/2016  . Herpes zoster without complication 44/81/8563  . Osteopenia 02/21/2017  . Migraine without aura and without status migrainosus, not intractable 02/21/2017  . Elevated vitamin B12 level 02/24/2017  . Allergy to multiple antibiotics 02/24/2017  . Elevated blood pressure reading 06/13/2017  . Caregiver stress syndrome 06/13/2017  . Thumb pain, right 06/30/2017  . Atrophic vaginitis 09/22/2017  . Nummular eczema 11/18/2017  . Left leg pain 03/05/2019  . Contusion of right thigh 03/07/2019  . Right leg swelling 03/07/2019  . Discoid lupus erythematosus 03/07/2019  . Trochanteric bursitis of right hip 03/07/2019  . Chronic pain of right knee 11/23/2019   Resolved Ambulatory Problems    Diagnosis Date Noted  . Intertrigo 02/12/2017  . GERD (gastroesophageal reflux disease) 02/21/2017   Past Medical History:  Diagnosis Date  . Epilepsy ([place])   . Lupus ([place])      Review of Systems  See HPI.      Objective:   Physical Exam Vitals reviewed.  Constitutional:      Appearance: Normal appearance. She is obese.  HENT:     Head: Normocephalic.     Right Ear: Tympanic membrane, ear canal and external ear normal. There is no impacted cerumen.     Left Ear: Tympanic membrane, ear canal and external  ear normal. There is no impacted cerumen.     Nose: Nose normal.     Mouth/Throat:     Mouth: Mucous membranes are moist.     Pharynx: No oropharyngeal exudate or posterior oropharyngeal erythema.  Eyes:     Extraocular Movements: Extraocular movements intact.     Conjunctiva/sclera: Conjunctivae normal.     Pupils: Pupils are equal, round, and reactive to light.  Neck:     Comments: Slightly tender enlarged cervical lymph node on left neck.  Cardiovascular:     Rate and Rhythm: Normal rate and regular rhythm.     Pulses: Normal pulses.  Pulmonary:     Effort: Pulmonary effort is normal.     Breath sounds: Normal breath sounds. No wheezing or rhonchi.  Neurological:     General: No focal deficit present.     Mental Status: She is alert and oriented to person, place, and time.  Psychiatric:        Mood and Affect: Mood normal.        Behavior: Behavior normal.           Assessment & Plan:  Marland KitchenMarland KitchenShalyn was seen today for ear pain and lymphadenopathy.  Diagnoses and all orders for this visit:  Lymph node enlargement -     CBC with Differential/Platelet  Left ear pain -     CBC with Differential/Platelet  Medication management -  CBC with Differential/Platelet  no concerning features of infection today. Could be viral in nature or could be side effect of cellcept. Continue to watch. If lymph node swelling gets bigger and more painful let me know.  Start vitamin C 1000mg  daily and zinc 100mg  daily. Rest this weekend. Ok to take ibuprofen or tylenol. If symptoms worsen let me know or progress.  Ordered CBC to look at WBC.

## 2019-12-14 NOTE — Patient Instructions (Signed)
Vitamin C 1000mg  Zinc 100mg    Mycophenolate tablets What is this medicine? MYCOPHENOLATE MOFETIL (mye koe FEN oh late MOE fe til) is used to decrease the immune system's response to a transplanted organ. This medicine may be used for other purposes; ask your health care provider or pharmacist if you have questions. COMMON BRAND NAME(S): CellCept What should I tell my health care provider before I take this medicine? They need to know if you have any of these conditions:  anemia or other blood disorder  cancer  diarrhea  immune system problems  infection (especially a viral infection such as chickenpox, cold sores, or herpes)  kidney disease  recently received or scheduled to receive a vaccination  stomach problems  an unusual or allergic reaction to mycophenolate mofetil, other medicines, foods, dyes, or preservatives  pregnant or trying to get pregnant  breast-feeding How should I use this medicine? Take this medicine by mouth with a full glass of water. Follow the directions on the prescription label. Take this medicine on an empty stomach, at least 1 hour before or 2 hours after food. Do not take with food unless your doctor approves. Do not cut, crush, or chew the medicine. Swallow the tablets whole. If the medicine is broken or is not intact, do not get the powder on your skin or eyes. If contact occurs, rinse thoroughly with water. Take your medicine at regular intervals. Do not take your medicine more often than directed. Do not stop taking except on your doctor's advice. A special MedGuide will be given to you by the pharmacist with each prescription and refill. Be sure to read this information carefully each time. Talk to your pediatrician regarding the use of this medicine in children. While this drug may be prescribed for selected conditions, precautions do apply. Overdosage: If you think you have taken too much of this medicine contact a poison control center or  emergency room at once. NOTE: This medicine is only for you. Do not share this medicine with others. What if I miss a dose? If you miss a dose, take it as soon as you can. If it is almost time for your next dose, take only that dose. Do not take double or extra doses. What may interact with this medicine? Do not take this medicine with any of the following medications:  live vaccines This medicine may also interact with the following medications:  acyclovir or valacyclovir  azathioprine  birth control pills  certain antibiotics like ciprofloxacin, levofloxacin, norfloxacin, trimethoprim; sulfamethoxazole, penicillin, and amoxicillin; clavulanic acid  certain medicines for stomach problems like lansoprazole, omeprazole, or pantoprazole  cyclosporine  ganciclovir or valganciclovir  isavuconazonium  medicines for cholesterol like cholestyramine and colestipol  metronidazole  other mycophenolate medicines  probenecid  rifampin  sevelamer  stomach acid blockers like magnesium hydroxide and aluminum hydroxide  telmisartan This list may not describe all possible interactions. Give your health care provider a list of all the medicines, herbs, non-prescription drugs, or dietary supplements you use. Also tell them if you smoke, drink alcohol, or use illegal drugs. Some items may interact with your medicine. What should I watch for while using this medicine? Visit your doctor or health care professional for regular checks on your progress. You will need frequent blood checks during the first few months you are receiving the medicine. Keep out of the sun. If you cannot avoid being in the sun, wear protective clothing and use sunscreen. Do not use sun lamps or tanning beds/booths. This  medicine can cause birth defects. Do not get pregnant while taking this drug. Females will need to have a negative pregnancy test before starting this medicine. If sexually active, use 2 reliable forms  of birth control together for 4 weeks before starting this medicine, while you are taking this medicine, and for 6 weeks after you stop taking this medicine. Birth control pills alone may not work properly while you are taking this medicine. If you think that you might be pregnant talk to your doctor right away. Males who get this medicine must use a condom during sex with females who can get pregnant. If you get a woman pregnant, the baby could have birth defects. The baby could die before they are born. You will need to continue wearing a condom for 90 days after stopping the medicine. Tell your health care provider right away if your partner becomes pregnant while you are taking this medicine. Do not donate sperm while taking this medicine or for 90 days after stopping it. If you get a cold or other infection while receiving this medicine, call your doctor or health care professional. Do not treat yourself. The medicine may decrease your body's ability to fight infections. Do not give blood while taking this medicine or for 6 weeks after stopping it. You may get drowsy or dizzy. Do not drive, use machinery, or do anything that needs mental alertness until you know how this medicine affects you. Do not stand up or sit up quickly, especially if you are an older patient. This reduces the risk of dizzy or fainting spells. What side effects may I notice from receiving this medicine? Side effects that you should report to your doctor or health care professional as soon as possible:  allergic reactions like skin rash, itching or hives, swelling of the face, lips, or tongue  bloody, dark, or tarry stools  changes in vision  dizziness  fever, chills or any other sign of infection  unusual bleeding or bruising  unusually weak or tired Side effects that usually do not require medical attention (report to your doctor or health care professional if they continue or are  bothersome):  constipation  diarrhea  trouble sleeping  loss of appetite  nausea, vomiting This list may not describe all possible side effects. Call your doctor for medical advice about side effects. You may report side effects to FDA at 1-800-FDA-1088. Where should I keep my medicine? Keep out of the reach of children. Store at room temperature between 15 and 30 degrees C (59 and 86 degrees F). Protect from light. Throw away any unused medicine after the expiration date. NOTE: This sheet is a summary. It may not cover all possible information. If you have questions about this medicine, talk to your doctor, pharmacist, or health care provider.  2020 Elsevier/Gold Standard (2019-02-26 19:54:00)

## 2019-12-17 ENCOUNTER — Other Ambulatory Visit: Payer: Self-pay

## 2019-12-17 ENCOUNTER — Ambulatory Visit (INDEPENDENT_AMBULATORY_CARE_PROVIDER_SITE_OTHER): Payer: 59 | Admitting: Physical Therapy

## 2019-12-17 DIAGNOSIS — R29898 Other symptoms and signs involving the musculoskeletal system: Secondary | ICD-10-CM

## 2019-12-17 DIAGNOSIS — M25561 Pain in right knee: Secondary | ICD-10-CM

## 2019-12-17 DIAGNOSIS — M6281 Muscle weakness (generalized): Secondary | ICD-10-CM | POA: Diagnosis not present

## 2019-12-17 NOTE — Therapy (Signed)
[place], [place], [address] Phone: [phone]   Fax:  [phone]  Physical Therapy Treatment  Patient Details  Name: Bonnie Allen MRN: [id number] Date of Birth: November 20, 1960 Referring Provider (PT): Iran Planas, [place]   Encounter Date: 12/17/2019  PT End of Session - 12/17/19 1134    Visit Number  6    Number of Visits  12    Date for PT Re-Evaluation  01/09/20    Authorization Type  aetna $50 copay    PT Start Time  1102    PT Stop Time  1146    PT Time Calculation (min)  44 min    Activity Tolerance  Patient tolerated treatment well;No increased pain    Behavior During Therapy  WFL for tasks assessed/performed       Past Medical History:  Diagnosis Date  . Epilepsy ([place])   . Lupus ([place])    skin    Past Surgical History:  Procedure Laterality Date  . CESAREAN SECTION    . CHOLECYSTECTOMY    . FOOT SURGERY    . KNEE SURGERY    . uterine ablation      There were no vitals filed for this visit.  Subjective Assessment - 12/17/19 1114    Subjective  Pt reports she has "twinge" pain in Rt knee with going down stairs. No pain with walking.  She is walking 30 min at 3 mph.    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  No/denies    Pain Score  0-No pain         [facility] PT Assessment - 12/17/19 0001      Assessment   Medical Diagnosis  M25.561,G89.29 (ICD-10-CM) - Chronic pain of right knee    Referring  Provider (PT)  Iran Planas, PA-C      Flexibility   Quadriceps  RLE: 122 deg       [facility] Adult PT Treatment/Exercise - 12/17/19 0001      Knee/Hip Exercises: Stretches   Passive Hamstring Stretch  Right;Left;2 reps;20 seconds    Quad Stretch  Right;30 seconds;3 reps;Left;1 rep   prone with strap, noodle above knee      Knee/Hip Exercises: Aerobic   Tread Mill  3.0 mph x 5 min, incline of 3.        Knee/Hip Exercises: Standing   Step Down  Left;1 set;10 reps;Hand Hold: 2   retro step with RLE, 3" step   Step Down Limitations  trial of step down ,retro step up with RLE on 8" step x 5 reps    Functional Squat Limitations  8 reps to black mat, with mirror for feedback.     Wall Squat  10 reps   5 reps, 5 sec hold   SLS  single leg forward leans to chair height x 5 reps each side.     Other Standing Knee Exercises  warrior 1 with back foot pointed  forward, heel raises x 10 each side.  warrior 2 x 30 sec, then 30 sec of pulses.       [employer]   Create Space  --   tape held; may tape next visit.        PT Long Term Goals - 12/17/19 1116      PT LONG TERM GOAL #1   Title  The patient will be indep with HEP    Time  6    Period  Weeks    Status  On-going      PT LONG TERM GOAL #2   Title  The patient will reduce functional limitation from 37% to < or equal to 25% per [employer].    Time  6    Period  Weeks    Status  On-going      PT LONG TERM GOAL #3   Title  The patient will report no pain R knee when descending 6" step x 12 reps (to simulate flight of stairs) in a reciprocal pattern.    Baseline  pain is 2/10 with descending stairs.    Time  6    Period  Weeks    Status  On-going      PT LONG TERM GOAL #4   Title  The patient will perform modified squat to return to home strengthening program for long term health and wellness.    Time  6    Period  Weeks    Status  On-going      PT LONG TERM GOAL #5   Title  The patient will return to walking > 2 miles  without increase in knee pain from baseline per report.    Baseline  up to 2 miles.    Time  6    Period  Weeks    Status  On-going            Plan - 12/17/19 1117    Clinical Impression Statement  Pt able to walk further distance without pain.  Some compensatory strategies noted with squat; increased Rt knee pain once corrected. Able to tolerate wall slide with better form.  Descending stairs is becoming easier with less pain. Progressing towards goals. Pt verbalized interest in holding therapy after next visit.    Examination-Participation Restrictions  Community Activity;Yard Work    Stability/Clinical Decision Making  Stable/Uncomplicated    Rehab Potential  Good    PT Frequency  2x / week    PT Duration  6 weeks    PT Treatment/Interventions  ADLs/Self Care Home Management;Cryotherapy;Moist Heat;Gait training;Stair training;Functional mobility training;Therapeutic activities;Therapeutic exercise;Neuromuscular re-education;Manual techniques;Taping;Patient/family education    PT Next Visit Plan  assess goals, FOTO.    PT Home Exercise Plan  Access Code: [email or web address]    Consulted and Agree with Plan of Care  Patient       Patient will benefit from skilled therapeutic intervention in order to improve the following deficits and impairments:  Abnormal gait, Pain, Decreased strength, Hypomobility, Impaired flexibility  Visit Diagnosis: Acute pain of right knee  Muscle weakness (generalized)  Other symptoms and signs involving the musculoskeletal system     Problem List Patient Active Problem List   Diagnosis Date Noted  . Chronic pain of right knee 11/23/2019  . Contusion of right thigh 03/07/2019  . Right leg swelling 03/07/2019  . Discoid lupus erythematosus 03/07/2019  . Trochanteric bursitis of right hip 03/07/2019  . Left leg pain 03/05/2019  . Nummular eczema 11/18/2017  .  Atrophic vaginitis 09/22/2017  . Thumb pain, right 06/30/2017  . Elevated blood pressure  reading 06/13/2017  . Caregiver stress syndrome 06/13/2017  . Elevated vitamin B12 level 02/24/2017  . Allergy to multiple antibiotics 02/24/2017  . Osteopenia 02/21/2017  . Migraine without aura and without status migrainosus, not intractable 02/21/2017  . Herpes zoster without complication 02/12/2017  . Hyperlipidemia LDL goal <130 11/26/2016  . OSA on CPAP 11/24/2016  . Seizure disorder [facility]) 11/24/2016   Mayer Camel, PTA 12/17/19 1:11 PM  [facility] [place] [address] [place], [place], [address] Phone: [phone]   Fax:  [phone]  Name: Bonnie Allen MRN: [id number] Date of Birth: 1961/06/01

## 2019-12-19 ENCOUNTER — Other Ambulatory Visit: Payer: Self-pay

## 2019-12-19 ENCOUNTER — Ambulatory Visit (INDEPENDENT_AMBULATORY_CARE_PROVIDER_SITE_OTHER): Payer: 59 | Admitting: Physical Therapy

## 2019-12-19 ENCOUNTER — Encounter: Payer: Self-pay | Admitting: Physical Therapy

## 2019-12-19 DIAGNOSIS — R29898 Other symptoms and signs involving the musculoskeletal system: Secondary | ICD-10-CM

## 2019-12-19 DIAGNOSIS — M25561 Pain in right knee: Secondary | ICD-10-CM

## 2019-12-19 DIAGNOSIS — M6281 Muscle weakness (generalized): Secondary | ICD-10-CM | POA: Diagnosis not present

## 2019-12-19 NOTE — Therapy (Addendum)
[place], [place], [address] Phone: [phone]   Fax:  [phone]  Physical Therapy Treatment and Discharge Summary  Patient Details  Name: Bonnie Allen MRN: [id number] Date of Birth: 05/22/1961 Referring Provider (PT): Iran Planas, [place]   Encounter Date: 12/19/2019  PT End of Session - 12/19/19 1100    Visit Number  7    Number of Visits  12    Date for PT Re-Evaluation  01/09/20    Authorization Type  aetna $50 copay    PT Start Time  1058    PT Stop Time  1140    PT Time Calculation (min)  42 min    Activity Tolerance  Patient tolerated treatment well;No increased pain    Behavior During Therapy  WFL for tasks assessed/performed       Past Medical History:  Diagnosis Date  . Epilepsy ([place])   . Lupus ([place])    skin    Past Surgical History:  Procedure Laterality Date  . CESAREAN SECTION    . CHOLECYSTECTOMY    . FOOT SURGERY    . KNEE SURGERY    . uterine ablation      There were no vitals filed for this visit.  Subjective Assessment - 12/19/19 1307    Subjective  Pt reports she has been working hard on the stairs, being very intentional on how she descends them; she can notice an improvement.  Exercises and walking are going well.    Patient Stated Goals  Return to weight training, ongoing strengthening for R leg, be able to be active for hiking    Currently in Pain?  No/denies    Pain Score  0-No pain         [facility] PT Assessment - 12/19/19 0001      Assessment   Medical Diagnosis  M25.561,G89.29 (ICD-10-CM) - Chronic pain of right knee    Referring Provider (PT)  Iran Planas, PA-C    Onset Date/Surgical Date  --   07/2020   Prior Therapy  none      Observation/Other Assessments   Focus on Therapeutic Outcomes (FOTO)   79%, (21% limited)                   [facility] Adult PT Treatment/Exercise - 12/19/19 0001      Knee/Hip Exercises: Stretches   Passive  Hamstring Stretch  Right;2 reps;20 seconds    Quad Stretch  Right;30 seconds;3 reps;Left;1 rep   prone with strap, noodle above knee    ITB Stretch  Right;2 reps;20 seconds    Other Knee/Hip Stretches  supine Rt adductor stretch with strap x 20 sec       Knee/Hip Exercises: Aerobic   Tread Mill  3.0 mph x 5 min, incline of 3.        Knee/Hip Exercises: Standing   Heel Raises  Right;1 set;5 reps    Lateral Step Up  Right;1 set;10 reps;Hand Hold: 1;Step Height: 6"    Forward Step Up  Right;1 set;10 reps   intermittent UE to steady; onto Bosu   Step Down  Left;1 set;5 reps;Hand Hold: 1;Step Height: 6"   retro step up with RLE   Functional Squat  5 reps   mini   Wall Squat  5 reps;5 seconds   with bicep curls, 5# x 3, with core in/out 5# x 2   SLS  SLS on Bosu x 20 sec each leg, intermittent UE support  to steady;  Single leg forward leans to touch chair height x 8 reps each leg.     Other Standing Knee Exercises  verbally reviewed HEP.       [employer]   Create Space  2 - I strips of Reg skin Rock tape applied to medial R t joint line to decompress tissue and improved proprioception                   PT Long Term Goals - 12/19/19 1110      PT LONG TERM GOAL #1   Title  The patient will be indep with HEP    Time  6    Period  Weeks    Status  On-going      PT LONG TERM GOAL #2   Title  The patient will reduce functional limitation from 37% to < or equal to 25% per [employer]    Time  6    Period  Weeks    Status  Achieved      PT LONG TERM GOAL #3   Title  The patient will report no pain R knee when descending 6" step x 12 reps (to simulate flight of stairs) in a reciprocal pattern.    Baseline  pain is 1/10 with descending stairs.    Time  6    Period  Weeks    Status  On-going      PT LONG TERM GOAL #4   Title  The patient will perform modified squat to return to home strengthening program for long term health and wellness.    Time  6    Period  Weeks     Status  Achieved      PT LONG TERM GOAL #5   Title  The patient will return to walking > 2 miles without increase in knee pain from baseline per report.    Baseline  up to 2 miles.    Time  6    Period  Weeks    Status  Partially Met            Plan - 12/19/19 1110    Clinical Impression Statement  Pt able to tolerate descending stairs with greater control and less pain.  She tolerated all exercises well, without increase in knee pain.  Pt has met majority of goals and verbalized desire to hold therapy while she works on [employer].    Examination-Participation Restrictions  Community Activity;Yard Work    Stability/Clinical Decision Making  Stable/Uncomplicated    Rehab Potential  Good    PT Frequency  2x / week    PT Duration  6 weeks    PT Treatment/Interventions  ADLs/Self Care Home Management;Cryotherapy;Moist Heat;Gait training;Stair training;Functional mobility training;Therapeutic activities;Therapeutic exercise;Neuromuscular re-education;Manual techniques;Taping;Patient/family education    PT Next Visit Plan  Will hold therapy until 3/24 per pt request.  If pt doesn't return by then, will d/c.    PT Home Exercise Plan  Access Code: [email or web address]    Consulted and Agree with Plan of Care  Patient       Patient will benefit from skilled therapeutic intervention in order to improve the following deficits and impairments:  Abnormal gait, Pain, Decreased strength, Hypomobility, Impaired flexibility  Visit Diagnosis: Acute pain of right knee  Muscle weakness (generalized)  Other symptoms and signs involving the musculoskeletal system     Problem List Patient Active Problem List   Diagnosis Date Noted  . Chronic pain of right knee 11/23/2019  .  Contusion of right thigh 03/07/2019  . Right leg swelling 03/07/2019  . Discoid lupus erythematosus 03/07/2019  . Trochanteric bursitis of right hip 03/07/2019  . Left leg pain 03/05/2019  . Nummular eczema 11/18/2017  . Atrophic  vaginitis 09/22/2017  . Thumb pain, right 06/30/2017  . Elevated blood pressure reading 06/13/2017  . Caregiver stress syndrome 06/13/2017  . Elevated vitamin B12 level 02/24/2017  . Allergy to multiple antibiotics 02/24/2017  . Osteopenia 02/21/2017  . Migraine without aura and without status migrainosus, not intractable 02/21/2017  . Herpes zoster without complication 41/32/4401  . Hyperlipidemia LDL goal <130 11/26/2016  . OSA on CPAP 11/24/2016  . Seizure disorder ([place]) 11/24/2016    PHYSICAL THERAPY DISCHARGE SUMMARY  Visits from Start of Care: 7  Current functional level related to goals / functional outcomes: See above   Remaining deficits: Unknown-- see above as patient did not return.   Education / Equipment: Home program  Plan: Patient agrees to discharge.  Patient goals were partially met. Patient is being discharged due to not returning since the last visit.  ?????        Thank you for the referral of this patient. Rudell Cobb, MPT  [place], [place] 12/19/19 1:08 PM  [facility] [place], [place], [address] Phone: [phone]   Fax:  [phone]  Name: Yulisa Chirico MRN: [id number] Date of Birth: Mar 04, 1961

## 2019-12-19 NOTE — Patient Instructions (Signed)
Access Code: [email or web address]  URL: [email or web address]  Date: 12/19/2019  Prepared by: Mayer Camel   Exercises  Seated Hip Flexor Stretch - 2 reps - 1 sets - 15-30 hold - 1x daily - 7x weekly  Wall Squat Hold with Ball - 10 reps - 1 sets - 5 hold - 1x daily - 7x weekly  Single Leg Heel Raise with Counter Support - 10 reps - 1 sets - 2x daily - 7x weekly  Warrior II - 2 reps - 1 sets - 30 hold - 1x daily - 7x weekly  Forward T - 8 reps - 1 sets - 1x daily - 7x weekly  Single Leg Bridge - 10 reps - 1 sets - 1x daily - 7x weekly  Long Sitting Straight Leg Raise with External Rotation - 10 reps - 1 sets - 1x daily - 7x weekly  Prone Quad Stretch with Towel Roll and Strap - 2-3 reps - 1 sets - 30 hold - 2x daily - 7x weekly  Supine ITB Stretch with Strap - 2-3 reps - 1 sets - 20 hold - 1x daily - 7x weekly  Hip Adductors and Hamstring Stretch with Strap - 2-3 reps - 1 sets - 20 hold - 1x daily - 7x weekly  Supine Hamstring Stretch with Strap - 2-3 reps - 1 sets - 20 hold - 1x daily - 7x weekly

## 2020-01-17 ENCOUNTER — Telehealth: Payer: Self-pay | Admitting: Neurology

## 2020-01-17 NOTE — Telephone Encounter (Signed)
Patient had a fall on 01/09/2020 after tripping on a power cord backstage. She did not hit her head or lose consciousness. She does have a bruise on her knee and arm. She just finished physical therapy but now having pain/swelling in knee and pain in both hips. She states just achy, not an acute pain. She wants referral back to physical therapy. She did make an appointment for evaluation of her knee/hips with [name] on 01/28/2020. She is okay doing home exercises given at PT and waiting til evaluation for the referral. She will call if anything changes (increase in pain, etc) prior to appt.   Bonnie Allen - please advise if patient needs anything different.

## 2020-01-18 MED ORDER — IBUPROFEN 800 MG PO TABS: 800.0000 mg | ORAL_TABLET | Freq: Three times a day (TID) | ORAL | 0 refills | Status: DC | PRN

## 2020-01-18 NOTE — Telephone Encounter (Signed)
Take ibuprofen 800mg  twice a day for the next 5 days.  ICe in am and PM. Until 4/5.  Wear knee compression sleeve if needed for any swelling.

## 2020-01-18 NOTE — Telephone Encounter (Signed)
Patient made aware. She expressed understanding.  °

## 2020-01-18 NOTE — Addendum Note (Signed)
Addended byJomarie Longs on: 01/18/2020 01:11 PM   Modules accepted: Orders

## 2020-01-28 ENCOUNTER — Ambulatory Visit: Payer: 59 | Admitting: Physician Assistant

## 2020-01-28 ENCOUNTER — Ambulatory Visit (INDEPENDENT_AMBULATORY_CARE_PROVIDER_SITE_OTHER): Payer: 59 | Admitting: Physician Assistant

## 2020-01-28 ENCOUNTER — Other Ambulatory Visit: Payer: Self-pay

## 2020-01-28 VITALS — Temp 98.1°F | Ht 58.5 in | Wt 152.0 lb

## 2020-01-28 DIAGNOSIS — Z23 Encounter for immunization: Secondary | ICD-10-CM | POA: Diagnosis not present

## 2020-01-28 NOTE — Progress Notes (Signed)
Patient presents to clinic for her second shingles vaccine. She tolerated the injection well in her left deltoid with no immediate complications. She reports that her arm was a little sore for a day or two after but then she was fine and did not have any reactions or symptoms.

## 2020-01-29 NOTE — Progress Notes (Signed)
Patient ID: Bonnie Allen, female   DOB: Nov 20, 1960, 59 y.o.   MRN: [id number] Agree with above plan. Tandy Gaw PA-C

## 2020-02-04 ENCOUNTER — Telehealth: Payer: Self-pay | Admitting: Neurology

## 2020-02-04 NOTE — Telephone Encounter (Signed)
Patient left vm that she did have a more severe reaction to her second shingles shot that she had last Monday. She wanted a call back to discuss some questions she had. [phone].

## 2020-02-04 NOTE — Telephone Encounter (Signed)
Spoke with patient. She states after Shingles vaccine last Monday she had redness/inflammation at injection site. She states it lasted until yesterday. She used ibuprofen and ice which didn't help. It is gone today.   She told her rheumatologist who suggested to call us to let us know, because now she may be allergic to the Covid vaccine. I let patient know no correlation to shingles and covid vaccinations and unfortunately no way to know if you are allergic until you have the vaccine. She is going to wait at least 4 weeks if she decides to get it.   Bonnie Allen - FYI.

## 2020-02-04 NOTE — Telephone Encounter (Signed)
Also those are fairly common reactions with shingles so it does not necessarily mean allergic but means body responded robustly to shot.

## 2020-05-24 ENCOUNTER — Other Ambulatory Visit: Payer: Self-pay | Admitting: Physician Assistant

## 2020-05-24 DIAGNOSIS — R4589 Other symptoms and signs involving emotional state: Secondary | ICD-10-CM

## 2020-07-02 ENCOUNTER — Encounter: Payer: Self-pay | Admitting: Physician Assistant

## 2020-07-02 ENCOUNTER — Ambulatory Visit (INDEPENDENT_AMBULATORY_CARE_PROVIDER_SITE_OTHER): Payer: 59 | Admitting: Physician Assistant

## 2020-07-02 VITALS — BP 144/84 | HR 70 | Ht 58.5 in | Wt 144.0 lb

## 2020-07-02 DIAGNOSIS — F418 Other specified anxiety disorders: Secondary | ICD-10-CM | POA: Diagnosis not present

## 2020-07-02 DIAGNOSIS — R4589 Other symptoms and signs involving emotional state: Secondary | ICD-10-CM

## 2020-07-02 DIAGNOSIS — M25562 Pain in left knee: Secondary | ICD-10-CM

## 2020-07-02 MED ORDER — ESCITALOPRAM OXALATE 10 MG PO TABS: 10.0000 mg | ORAL_TABLET | Freq: Every day | ORAL | 1 refills | Status: DC

## 2020-07-02 NOTE — Progress Notes (Signed)
Subjective:    Patient ID: Bonnie Allen, female    DOB: 1961-03-15, 59 y.o.   MRN: [id number]  HPI  Patient is a 59 year old female with discoid lupus erythematous, seizure disorder, depressed mood, anxiety who presents to the clinic with some mood changes.  He has been really hard few months.  Her lupus keeps her completely out of the sun.  This really isolates her during the summer months.  She is also having some problems with her son.  This really bothers her.  Her son seems to not care about her or want to come around.  She is also having some problems with increased seizures.  She is not able to drive.  Patient just feels very down and overwhelmed.  She feels very frequently emotional and cries easily.  She denies any suicidal thoughts or homicidal idealizations.  Lexapro has helped a lot but she is concerned about going up to the higher dose.  Her biggest concern is lowering her seizure threshold.  She also has some left knee pain today that started 2 days ago after her dog ran into her medial knee.  She did not fall.  She does feel like it is getting some better.  Overall she has been so much more active and continues to lose weight.  She has lost another 10 pounds from last visit.  .. Active Ambulatory Problems    Diagnosis Date Noted  . OSA on CPAP 11/24/2016  . Seizure disorder (HCC) 11/24/2016  . Hyperlipidemia LDL goal <130 11/26/2016  . Herpes zoster without complication 02/12/2017  . Osteopenia 02/21/2017  . Migraine without aura and without status migrainosus, not intractable 02/21/2017  . Elevated vitamin B12 level 02/24/2017  . Allergy to multiple antibiotics 02/24/2017  . Elevated blood pressure reading 06/13/2017  . Caregiver stress syndrome 06/13/2017  . Atrophic vaginitis 09/22/2017  . Nummular eczema 11/18/2017  . Discoid lupus erythematosus 03/07/2019  . Trochanteric bursitis of right hip 03/07/2019  . Chronic pain of right knee 11/23/2019  . Depressed mood  07/07/2020  . Anxiety about health 07/07/2020   Resolved Ambulatory Problems    Diagnosis Date Noted  . Intertrigo 02/12/2017  . GERD (gastroesophageal reflux disease) 02/21/2017  . Thumb pain, right 06/30/2017  . Left leg pain 03/05/2019  . Contusion of right thigh 03/07/2019   Past Medical History:  Diagnosis Date  . Epilepsy (HCC)   . Lupus (HCC)     Review of Systems See HPI.     Objective:   Physical Exam Vitals reviewed.  Constitutional:      Appearance: Normal appearance.  Cardiovascular:     Rate and Rhythm: Normal rate and regular rhythm.     Pulses: Normal pulses.     Heart sounds: Normal heart sounds.  Pulmonary:     Effort: Pulmonary effort is normal.     Breath sounds: Normal breath sounds.  Musculoskeletal:     Right lower leg: No edema.     Left lower leg: No edema.     Comments: Left knee:  Just below medial joint space.  No significant swelling.  NROM Strength 5/5. Tenderness to palpation in medial joint space. No laxity.  Negative anterior drawer.  Some pain with mcmurrays but no clicking.   Neurological:     General: No focal deficit present.     Mental Status: She is alert and oriented to person, place, and time.  Psychiatric:        Mood and Affect: Mood normal.       .Marland Kitchen  Depression screen [facility] 2/9 07/02/2020 11/21/2019 08/14/2019 11/18/2017 11/24/2016  Decreased Interest 1 1 0 0 0  Down, Depressed, Hopeless 1 1 0 0 0  PHQ - 2 Score 2 2 0 0 0  Altered sleeping 0 1 1 - -  Tired, decreased energy 1 1 1  - -  Change in appetite 1 0 0 - -  Feeling bad or failure about yourself  2 1 1  - -  Trouble concentrating 1 0 0 - -  Moving slowly or fidgety/restless 0 0 0 - -  Suicidal thoughts 0 0 0 - -  PHQ-9 Score 7 5 3  - -  Difficult doing work/chores - Somewhat difficult Somewhat difficult - -   . GAD 7 : Generalized Anxiety Score 07/02/2020 11/21/2019 08/14/2019  Nervous, Anxious, on Edge 1 1 0  Control/stop worrying 1 0 0  Worry too much -  different things 1 1 0  Trouble relaxing 1 1 1   Restless 1 0 0  Easily annoyed or irritable 2 1 1   Afraid - awful might happen 1 0 0  Total GAD 7 Score 8 4 2   Anxiety Difficulty Somewhat difficult Not difficult at all Somewhat difficult      Assessment & Plan:  11/8/20211/29/2021Mirielle was seen today for mental health problem.  Diagnoses and all orders for this visit:  Depressed mood -     escitalopram (LEXAPRO) 10 MG tablet; Take 1 tablet (10 mg total) by mouth daily. -     Ambulatory referral to Psychology  Anxiety about health -     escitalopram (LEXAPRO) 10 MG tablet; Take 1 tablet (10 mg total) by mouth daily. -     Ambulatory referral to Psychology  Acute pain of left knee   Her main concern is her worsening mood today.  PHQ and GAD scores do show worsening depression and anxiety.  I do think a lot of this has to do with her chronic diseases.  The limitation put on her by lupus and seizures has been increased over the past 6 months.  She also does have some family dynamic stress with her son.  I think she would strongly benefit from counseling.  I would like to be able to increase her Lexapro.  I did explain this does come with the risk of increasing frequency of seizures.  She would like to hold on this increase right now.  Follow-up as needed.  She is doing great with exercising.  Discussed doing things indoors that bring her joy in purpose.  Follow-up if any sudden worsening.  Reassured patient overall her knee looked good on exam today.  She likely had a twisting injury that inflamed some of the cartilage and perhaps meniscus.  NSAIDs for a few days with icing could be very helpful.  She should consider a knee sleeve for some extra support for the next week or so.  I did give her some knee exercises that she can start in the next day or so to strengthen the muscles above and below the knee follow-up as needed or if symptoms change or worsen.  seizure managed by neurology.  Lupus managed by  rhumatology.

## 2020-07-07 DIAGNOSIS — F418 Other specified anxiety disorders: Secondary | ICD-10-CM | POA: Insufficient documentation

## 2020-07-07 DIAGNOSIS — R4589 Other symptoms and signs involving emotional state: Secondary | ICD-10-CM | POA: Insufficient documentation

## 2020-07-10 LAB — HM MAMMOGRAPHY

## 2020-07-22 ENCOUNTER — Encounter: Payer: Self-pay | Admitting: Physician Assistant

## 2020-07-22 ENCOUNTER — Ambulatory Visit: Payer: 59 | Admitting: Professional

## 2020-07-29 ENCOUNTER — Ambulatory Visit (INDEPENDENT_AMBULATORY_CARE_PROVIDER_SITE_OTHER): Payer: 59 | Admitting: Professional

## 2020-07-29 DIAGNOSIS — F4320 Adjustment disorder, unspecified: Secondary | ICD-10-CM

## 2020-08-05 ENCOUNTER — Ambulatory Visit (INDEPENDENT_AMBULATORY_CARE_PROVIDER_SITE_OTHER): Payer: 59 | Admitting: Professional

## 2020-08-05 DIAGNOSIS — F4320 Adjustment disorder, unspecified: Secondary | ICD-10-CM | POA: Diagnosis not present

## 2020-08-14 ENCOUNTER — Ambulatory Visit (INDEPENDENT_AMBULATORY_CARE_PROVIDER_SITE_OTHER): Payer: 59 | Admitting: Professional

## 2020-08-14 DIAGNOSIS — F4320 Adjustment disorder, unspecified: Secondary | ICD-10-CM | POA: Diagnosis not present

## 2020-08-21 ENCOUNTER — Ambulatory Visit (INDEPENDENT_AMBULATORY_CARE_PROVIDER_SITE_OTHER): Payer: 59 | Admitting: Professional

## 2020-08-21 DIAGNOSIS — F4320 Adjustment disorder, unspecified: Secondary | ICD-10-CM | POA: Diagnosis not present

## 2020-09-03 ENCOUNTER — Ambulatory Visit (INDEPENDENT_AMBULATORY_CARE_PROVIDER_SITE_OTHER): Payer: 59 | Admitting: Professional

## 2020-09-03 DIAGNOSIS — F4320 Adjustment disorder, unspecified: Secondary | ICD-10-CM

## 2020-09-15 ENCOUNTER — Ambulatory Visit (INDEPENDENT_AMBULATORY_CARE_PROVIDER_SITE_OTHER): Payer: 59 | Admitting: Professional

## 2020-09-15 DIAGNOSIS — F4320 Adjustment disorder, unspecified: Secondary | ICD-10-CM | POA: Diagnosis not present

## 2020-09-29 ENCOUNTER — Ambulatory Visit (INDEPENDENT_AMBULATORY_CARE_PROVIDER_SITE_OTHER): Payer: 59 | Admitting: Professional

## 2020-09-29 DIAGNOSIS — F4320 Adjustment disorder, unspecified: Secondary | ICD-10-CM

## 2020-10-01 ENCOUNTER — Encounter: Payer: Self-pay | Admitting: Physician Assistant

## 2020-10-01 ENCOUNTER — Ambulatory Visit (INDEPENDENT_AMBULATORY_CARE_PROVIDER_SITE_OTHER): Payer: 59 | Admitting: Physician Assistant

## 2020-10-01 ENCOUNTER — Other Ambulatory Visit: Payer: Self-pay

## 2020-10-01 VITALS — BP 133/85 | HR 70 | Ht 58.5 in | Wt 146.0 lb

## 2020-10-01 DIAGNOSIS — Z13820 Encounter for screening for osteoporosis: Secondary | ICD-10-CM

## 2020-10-01 DIAGNOSIS — Z13220 Encounter for screening for lipoid disorders: Secondary | ICD-10-CM | POA: Diagnosis not present

## 2020-10-01 DIAGNOSIS — Z131 Encounter for screening for diabetes mellitus: Secondary | ICD-10-CM

## 2020-10-01 DIAGNOSIS — R4589 Other symptoms and signs involving emotional state: Secondary | ICD-10-CM

## 2020-10-01 DIAGNOSIS — Z1329 Encounter for screening for other suspected endocrine disorder: Secondary | ICD-10-CM

## 2020-10-01 DIAGNOSIS — F418 Other specified anxiety disorders: Secondary | ICD-10-CM

## 2020-10-01 DIAGNOSIS — Z0000 Encounter for general adult medical examination without abnormal findings: Secondary | ICD-10-CM | POA: Diagnosis not present

## 2020-10-01 NOTE — Patient Instructions (Signed)
Health Maintenance, Female Adopting a healthy lifestyle and getting preventive care are important in promoting health and wellness. Ask your health care provider about:  The right schedule for you to have regular tests and exams.  Things you can do on your own to prevent diseases and keep yourself healthy. What should I know about diet, weight, and exercise? Eat a healthy diet   Eat a diet that includes plenty of vegetables, fruits, low-fat dairy products, and lean protein.  Do not eat a lot of foods that are high in solid fats, added sugars, or sodium. Maintain a healthy weight Body mass index (BMI) is used to identify weight problems. It estimates body fat based on height and weight. Your health care provider can help determine your BMI and help you achieve or maintain a healthy weight. Get regular exercise Get regular exercise. This is one of the most important things you can do for your health. Most adults should:  Exercise for at least 150 minutes each week. The exercise should increase your heart rate and make you sweat (moderate-intensity exercise).  Do strengthening exercises at least twice a week. This is in addition to the moderate-intensity exercise.  Spend less time sitting. Even light physical activity can be beneficial. Watch cholesterol and blood lipids Have your blood tested for lipids and cholesterol at 61 years of age, then have this test every 5 years. Have your cholesterol levels checked more often if:  Your lipid or cholesterol levels are high.  You are older than 61 years of age.  You are at high risk for heart disease. What should I know about cancer screening? Depending on your health history and family history, you may need to have cancer screening at various ages. This may include screening for:  Breast cancer.  Cervical cancer.  Colorectal cancer.  Skin cancer.  Lung cancer. What should I know about heart disease, diabetes, and high blood  pressure? Blood pressure and heart disease  High blood pressure causes heart disease and increases the risk of stroke. This is more likely to develop in people who have high blood pressure readings, are of African descent, or are overweight.  Have your blood pressure checked: ? Every 3-5 years if you are 18-39 years of age. ? Every year if you are 40 years old or older. Diabetes Have regular diabetes screenings. This checks your fasting blood sugar level. Have the screening done:  Once every three years after age 40 if you are at a normal weight and have a low risk for diabetes.  More often and at a younger age if you are overweight or have a high risk for diabetes. What should I know about preventing infection? Hepatitis B If you have a higher risk for hepatitis B, you should be screened for this virus. Talk with your health care provider to find out if you are at risk for hepatitis B infection. Hepatitis C Testing is recommended for:  Everyone born from 1945 through 1965.  Anyone with known risk factors for hepatitis C. Sexually transmitted infections (STIs)  Get screened for STIs, including gonorrhea and chlamydia, if: ? You are sexually active and are younger than 61 years of age. ? You are older than 61 years of age and your health care provider tells you that you are at risk for this type of infection. ? Your sexual activity has changed since you were last screened, and you are at increased risk for chlamydia or gonorrhea. Ask your health care provider if   you are at risk.  Ask your health care provider about whether you are at high risk for HIV. Your health care provider may recommend a prescription medicine to help prevent HIV infection. If you choose to take medicine to prevent HIV, you should first get tested for HIV. You should then be tested every 3 months for as long as you are taking the medicine. Pregnancy  If you are about to stop having your period (premenopausal) and  you may become pregnant, seek counseling before you get pregnant.  Take 400 to 800 micrograms (mcg) of folic acid every day if you become pregnant.  Ask for birth control (contraception) if you want to prevent pregnancy. Osteoporosis and menopause Osteoporosis is a disease in which the bones lose minerals and strength with aging. This can result in bone fractures. If you are 65 years old or older, or if you are at risk for osteoporosis and fractures, ask your health care provider if you should:  Be screened for bone loss.  Take a calcium or vitamin D supplement to lower your risk of fractures.  Be given hormone replacement therapy (HRT) to treat symptoms of menopause. Follow these instructions at home: Lifestyle  Do not use any products that contain nicotine or tobacco, such as cigarettes, e-cigarettes, and chewing tobacco. If you need help quitting, ask your health care provider.  Do not use street drugs.  Do not share needles.  Ask your health care provider for help if you need support or information about quitting drugs. Alcohol use  Do not drink alcohol if: ? Your health care provider tells you not to drink. ? You are pregnant, may be pregnant, or are planning to become pregnant.  If you drink alcohol: ? Limit how much you use to 0-1 drink a day. ? Limit intake if you are breastfeeding.  Be aware of how much alcohol is in your drink. In the [place] one drink equals one 12 oz bottle of beer (355 mL), one 5 oz glass of wine (148 mL), or one 1 oz glass of hard liquor (44 mL). General instructions  Schedule regular health, dental, and eye exams.  Stay current with your vaccines.  Tell your health care provider if: ? You often feel depressed. ? You have ever been abused or do not feel safe at home. Summary  Adopting a healthy lifestyle and getting preventive care are important in promoting health and wellness.  Follow your health care provider's instructions about healthy  diet, exercising, and getting tested or screened for diseases.  Follow your health care provider's instructions on monitoring your cholesterol and blood pressure. This information is not intended to replace advice given to you by your health care provider. Make sure you discuss any questions you have with your health care provider. Document Revised: 10/04/2018 Document Reviewed: 10/04/2018 Elsevier Patient Education  2020 [employer]  

## 2020-10-01 NOTE — Progress Notes (Signed)
Subjective:    Patient ID: Bonnie Allen, female    DOB: 09/20/1961, 59 y.o.   MRN: [id number]  HPI  Patient is a 59 year old female with seizure disorder, lupus, Sjogren's, anxiety and depression who presents to the clinic for follow-up. At last visit I suggested counseling due to her mood changes dealing with her chronic health conditions as well as increased her Lexapro to 10 mg. She has been compliant with her medication as well as counseling. She does feel like both are helping a lot. She feels like she is much better able to handle the stresses of life and be overall happy in her current state. She denies any suicidal thoughts or homicidal idealizations.  Her seizures have been well controlled recently. She has not had any recent seizure activity in the last 3 months.     .. Active Ambulatory Problems    Diagnosis Date Noted  . OSA on CPAP 11/24/2016  . Seizure disorder (HCC) 11/24/2016  . Hyperlipidemia LDL goal <130 11/26/2016  . Herpes zoster without complication 02/12/2017  . Osteopenia 02/21/2017  . Migraine without aura and without status migrainosus, not intractable 02/21/2017  . Elevated vitamin B12 level 02/24/2017  . Allergy to multiple antibiotics 02/24/2017  . Elevated blood pressure reading 06/13/2017  . Caregiver stress syndrome 06/13/2017  . Atrophic vaginitis 09/22/2017  . Nummular eczema 11/18/2017  . Discoid lupus erythematosus 03/07/2019  . Trochanteric bursitis of right hip 03/07/2019  . Chronic pain of right knee 11/23/2019  . Depressed mood 07/07/2020  . Anxiety about health 07/07/2020   Resolved Ambulatory Problems    Diagnosis Date Noted  . Intertrigo 02/12/2017  . GERD (gastroesophageal reflux disease) 02/21/2017  . Thumb pain, right 06/30/2017  . Left leg pain 03/05/2019  . Contusion of right thigh 03/07/2019   Past Medical History:  Diagnosis Date  . Epilepsy (HCC)   . Lupus (HCC)    .Marland Kitchen Family History  Problem Relation Age of Onset  .  Stroke Mother   . Hypertension Mother   . Hypertension Father   . Parkinson's disease Father   . Hyperlipidemia Father   . Drug abuse Paternal Uncle   . Cancer Paternal Grandmother        lung  . Stroke Paternal Grandmother   . Cancer Paternal Grandfather        lung     Review of Systems  All other systems reviewed and are negative.      Objective:   Physical Exam Vitals reviewed.  Constitutional:      Appearance: Normal appearance. She is obese.  HENT:     Head: Normocephalic.  Neck:     Vascular: No carotid bruit.  Cardiovascular:     Rate and Rhythm: Normal rate and regular rhythm.     Pulses: Normal pulses.     Heart sounds: Normal heart sounds.  Pulmonary:     Effort: Pulmonary effort is normal.     Breath sounds: Normal breath sounds.  Abdominal:     General: Bowel sounds are normal. There is no distension.     Palpations: Abdomen is soft.     Tenderness: There is no abdominal tenderness. There is no right CVA tenderness, left CVA tenderness, guarding or rebound.  Musculoskeletal:     Right lower leg: No edema.     Left lower leg: No edema.  Neurological:     General: No focal deficit present.     Mental Status: She is alert and oriented to person,  place, and time.  Psychiatric:        Mood and Affect: Mood normal.        Behavior: Behavior normal.    .. Depression screen [facility] 2/9 10/01/2020 07/02/2020 11/21/2019 08/14/2019 11/18/2017  Decreased Interest 1 1 1  0 0  Down, Depressed, Hopeless 1 1 1  0 0  PHQ - 2 Score 2 2 2  0 0  Altered sleeping 1 0 1 1 -  Tired, decreased energy 0 1 1 1  -  Change in appetite 1 1 0 0 -  Feeling bad or failure about yourself  1 2 1 1  -  Trouble concentrating 0 1 0 0 -  Moving slowly or fidgety/restless 0 0 0 0 -  Suicidal thoughts 0 0 0 0 -  PHQ-9 Score 5 7 5 3  -  Difficult doing work/chores Somewhat difficult - Somewhat difficult Somewhat difficult -   .. GAD 7 : Generalized Anxiety Score 10/01/2020 07/02/2020 11/21/2019  08/14/2019  Nervous, Anxious, on Edge 1 1 1  0  Control/stop worrying 0 1 0 0  Worry too much - different things 1 1 1  0  Trouble relaxing 1 1 1 1   Restless 0 1 0 0  Easily annoyed or irritable 1 2 1 1   Afraid - awful might happen 0 1 0 0  Total GAD 7 Score 4 8 4 2   Anxiety Difficulty Somewhat difficult Somewhat difficult Not difficult at all Somewhat difficult           Assessment & Plan:   Dyani was seen today for follow-up.  Diagnoses and all orders for this visit:  Depressed mood  Preventative health care -     Lipid Panel w/reflex Direct LDL -     COMPLETE METABOLIC PANEL WITH GFR  Screening for lipid disorders -     Lipid Panel w/reflex Direct LDL -     COMPLETE METABOLIC PANEL WITH GFR  Screening for diabetes mellitus  Screening for thyroid disorder -     TSH  Anxiety about health   PHQ and GAD numbers were improved. Patient is compliant with counseling and feels like this is helping. Lexapro 10 mg is doing very well. Continue on the same dose.  In the next month patient will be due for her preventative screenings. Labs placed today. Patient has had Cologuard we does need to enter it into the computer. Patient is high risk for osteoporosis. We will start screening a little earlier with DEXA.  For seizures continue neurology management.  For lupus/Sjorens continue rheumatology for management.

## 2020-10-03 ENCOUNTER — Encounter: Payer: Self-pay | Admitting: Physician Assistant

## 2020-10-13 ENCOUNTER — Ambulatory Visit: Payer: 59 | Admitting: Professional

## 2020-10-15 ENCOUNTER — Other Ambulatory Visit: Payer: 59

## 2020-10-27 ENCOUNTER — Ambulatory Visit: Payer: 59 | Admitting: Professional

## 2020-10-30 ENCOUNTER — Ambulatory Visit (INDEPENDENT_AMBULATORY_CARE_PROVIDER_SITE_OTHER): Payer: 59 | Admitting: Professional

## 2020-10-30 DIAGNOSIS — F4320 Adjustment disorder, unspecified: Secondary | ICD-10-CM

## 2020-11-06 ENCOUNTER — Other Ambulatory Visit: Payer: Self-pay | Admitting: Physician Assistant

## 2020-11-06 DIAGNOSIS — F418 Other specified anxiety disorders: Secondary | ICD-10-CM

## 2020-11-06 DIAGNOSIS — R4589 Other symptoms and signs involving emotional state: Secondary | ICD-10-CM

## 2020-11-11 ENCOUNTER — Ambulatory Visit: Payer: 59 | Admitting: Professional

## 2020-11-17 ENCOUNTER — Ambulatory Visit (INDEPENDENT_AMBULATORY_CARE_PROVIDER_SITE_OTHER): Payer: No Typology Code available for payment source | Admitting: Professional

## 2020-11-17 DIAGNOSIS — F4320 Adjustment disorder, unspecified: Secondary | ICD-10-CM | POA: Diagnosis not present

## 2020-12-11 ENCOUNTER — Ambulatory Visit (INDEPENDENT_AMBULATORY_CARE_PROVIDER_SITE_OTHER): Payer: No Typology Code available for payment source | Admitting: Professional

## 2020-12-11 DIAGNOSIS — F4320 Adjustment disorder, unspecified: Secondary | ICD-10-CM

## 2020-12-23 ENCOUNTER — Ambulatory Visit (INDEPENDENT_AMBULATORY_CARE_PROVIDER_SITE_OTHER): Payer: No Typology Code available for payment source | Admitting: Professional

## 2020-12-23 DIAGNOSIS — F4320 Adjustment disorder, unspecified: Secondary | ICD-10-CM | POA: Diagnosis not present

## 2021-01-06 ENCOUNTER — Ambulatory Visit (INDEPENDENT_AMBULATORY_CARE_PROVIDER_SITE_OTHER): Payer: No Typology Code available for payment source | Admitting: Professional

## 2021-01-06 DIAGNOSIS — F4320 Adjustment disorder, unspecified: Secondary | ICD-10-CM | POA: Diagnosis not present

## 2021-01-08 ENCOUNTER — Ambulatory Visit: Payer: No Typology Code available for payment source | Admitting: Professional

## 2021-01-19 ENCOUNTER — Ambulatory Visit (INDEPENDENT_AMBULATORY_CARE_PROVIDER_SITE_OTHER): Payer: No Typology Code available for payment source | Admitting: Professional

## 2021-01-19 DIAGNOSIS — F4320 Adjustment disorder, unspecified: Secondary | ICD-10-CM

## 2021-02-09 ENCOUNTER — Ambulatory Visit: Payer: No Typology Code available for payment source | Admitting: Professional

## 2021-04-14 ENCOUNTER — Other Ambulatory Visit: Payer: Self-pay | Admitting: Physician Assistant

## 2021-04-14 DIAGNOSIS — R4589 Other symptoms and signs involving emotional state: Secondary | ICD-10-CM

## 2021-04-14 DIAGNOSIS — F418 Other specified anxiety disorders: Secondary | ICD-10-CM

## 2021-08-31 ENCOUNTER — Other Ambulatory Visit: Payer: Self-pay | Admitting: Physician Assistant

## 2021-08-31 DIAGNOSIS — F418 Other specified anxiety disorders: Secondary | ICD-10-CM

## 2021-08-31 DIAGNOSIS — R4589 Other symptoms and signs involving emotional state: Secondary | ICD-10-CM

## 2021-09-22 IMAGING — DX DG KNEE COMPLETE 4+V*R*
5 series · 5 of 5 positions shown · non-contrast
Comparison: None

CLINICAL DATA: Generalized RIGHT knee pain for 4 weeks, or recently
started weight training, landed wrong on her knee during a jumping
squat 1 week ago which has worsened pain, prior arthroscopic knee
surgery in [id number]

EXAM:
RIGHT KNEE - COMPLETE 4+ VIEW

[knee ap]
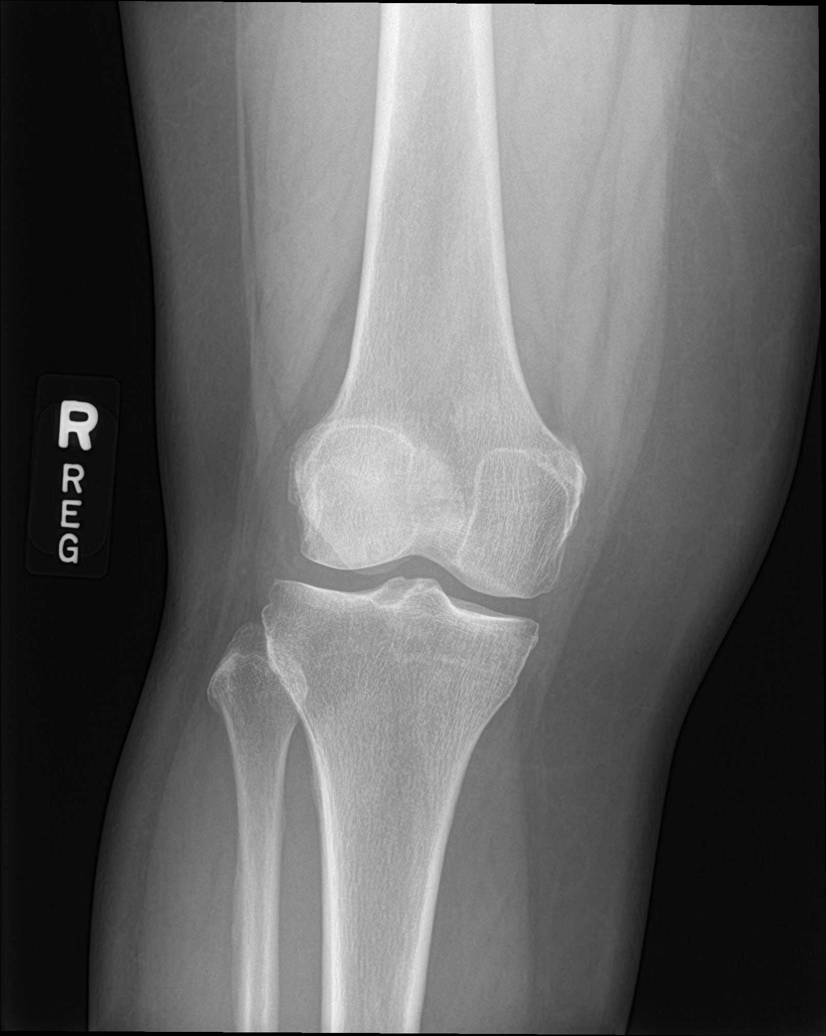

[knee lat]
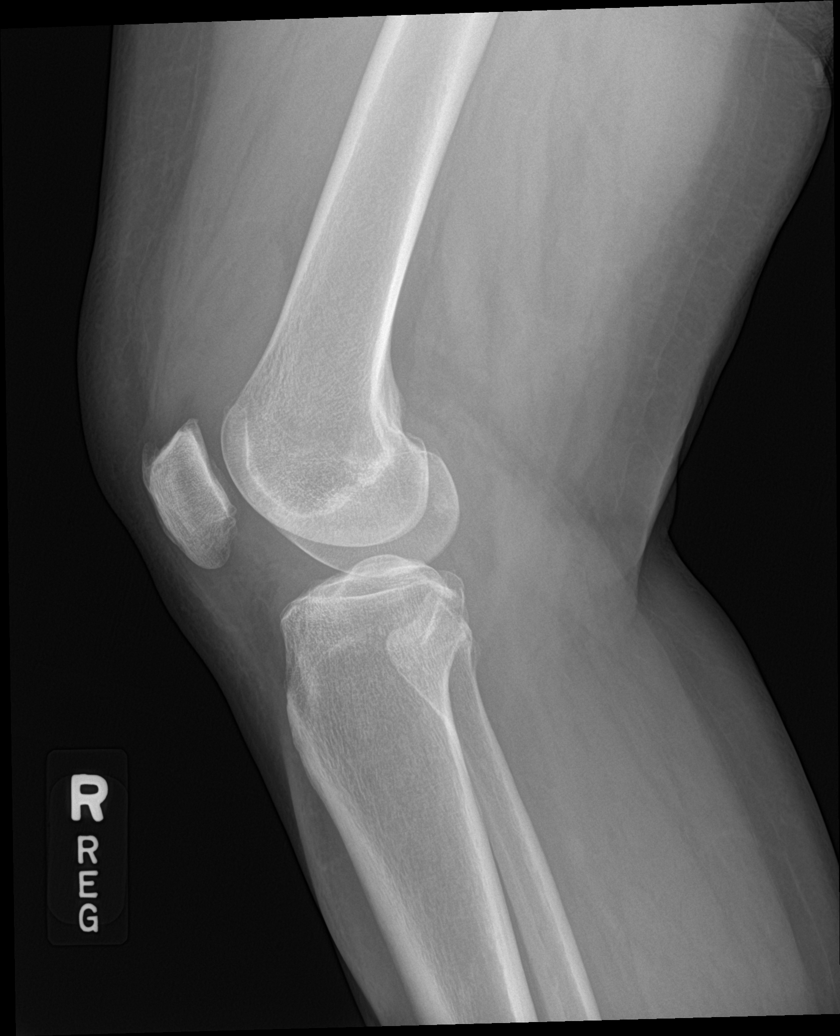

[knee sunrise]
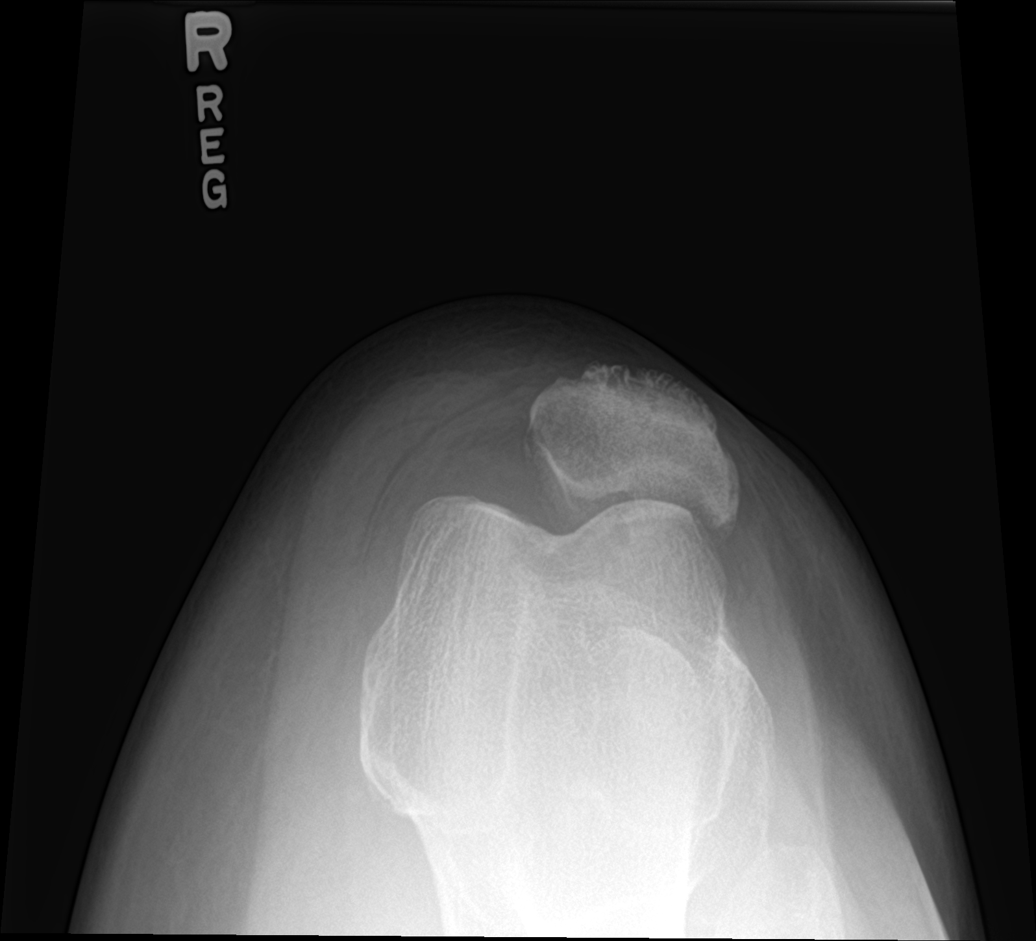

[knee obl (1 of 2)]
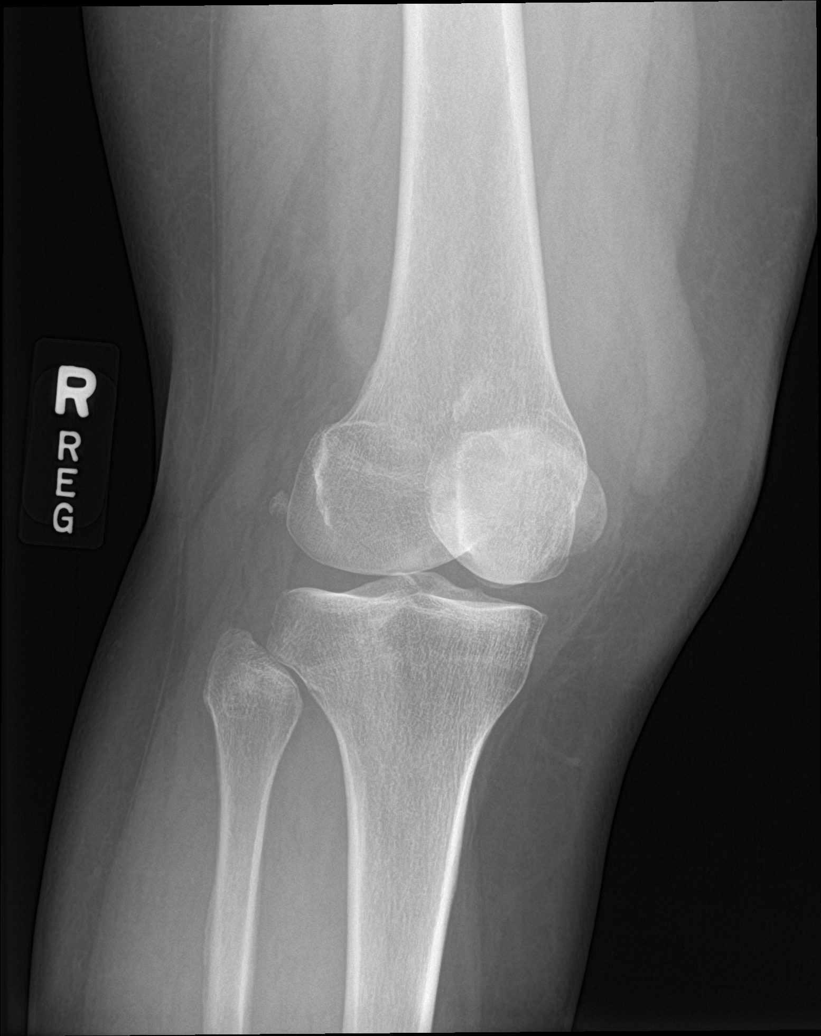

[knee obl (2 of 2)]
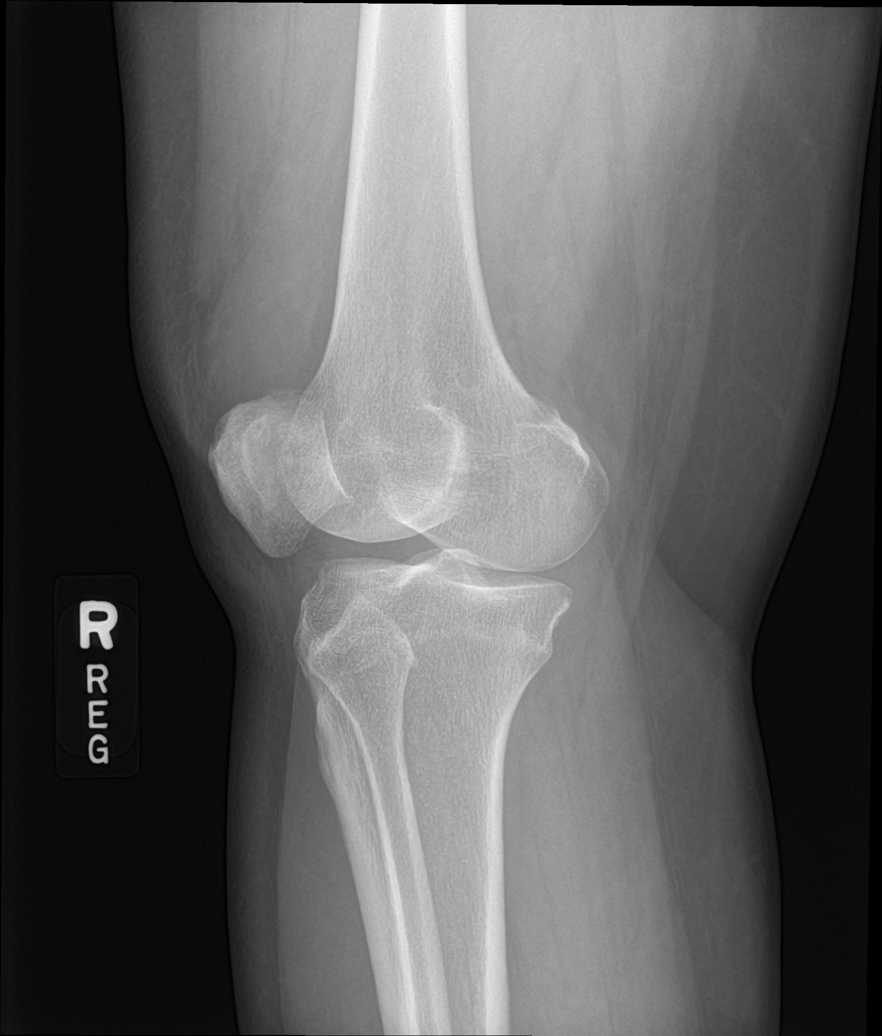

[5 of 5 positions shown; findings below may reference images not displayed]

FINDINGS: Osseous mineralization low normal.

Joint spaces preserved.

Moderate joint effusion present.

Lateral patellar tilt.

No acute fracture, dislocation, or bone destruction.
IMPRESSION: Moderate RIGHT knee joint effusion.

Lateral patellar tilt.

No acute bony abnormalities.
